# Patient Record
Sex: Female | Born: 1955 | Race: Black or African American | Hispanic: No | Marital: Single | Smoking: Current every day smoker
Health system: Southern US, Community
[De-identification: ages and names within clinical notes are randomized; demographics above are authoritative.]

## PROBLEM LIST (undated history)

## (undated) DIAGNOSIS — I10 Essential (primary) hypertension: Secondary | ICD-10-CM

## (undated) DIAGNOSIS — M1990 Unspecified osteoarthritis, unspecified site: Secondary | ICD-10-CM

## (undated) DIAGNOSIS — I639 Cerebral infarction, unspecified: Secondary | ICD-10-CM

## (undated) DIAGNOSIS — J449 Chronic obstructive pulmonary disease, unspecified: Secondary | ICD-10-CM

## (undated) HISTORY — PX: ABDOMINAL HYSTERECTOMY: SHX81

---

## 2011-05-10 ENCOUNTER — Encounter: Payer: Self-pay | Admitting: *Deleted

## 2011-05-10 ENCOUNTER — Emergency Department (HOSPITAL_BASED_OUTPATIENT_CLINIC_OR_DEPARTMENT_OTHER)
Admission: EM | Admit: 2011-05-10 | Discharge: 2011-05-10 | Discharge status: Against Medical Advice | Payer: No Typology Code available for payment source | Attending: Emergency Medicine | Admitting: Emergency Medicine

## 2011-05-10 DIAGNOSIS — M25519 Pain in unspecified shoulder: Secondary | ICD-10-CM | POA: Insufficient documentation

## 2011-05-10 HISTORY — DX: Essential (primary) hypertension: I10

## 2011-05-10 HISTORY — DX: Unspecified osteoarthritis, unspecified site: M19.90

## 2011-05-10 HISTORY — DX: Chronic obstructive pulmonary disease, unspecified: J44.9
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
## 2012-10-26 ENCOUNTER — Ambulatory Visit (HOSPITAL_COMMUNITY)
Admission: RE | Admit: 2012-10-26 | Discharge: 2012-10-26 | Disposition: A | Discharge status: Home / Self Care | Payer: Self-pay | Source: Ambulatory Visit | Attending: General Surgery | Admitting: General Surgery

## 2012-10-26 ENCOUNTER — Encounter (HOSPITAL_BASED_OUTPATIENT_CLINIC_OR_DEPARTMENT_OTHER): Payer: Self-pay | Attending: General Surgery

## 2012-10-26 ENCOUNTER — Other Ambulatory Visit (HOSPITAL_BASED_OUTPATIENT_CLINIC_OR_DEPARTMENT_OTHER): Payer: Self-pay | Admitting: General Surgery

## 2012-10-26 DIAGNOSIS — L84 Corns and callosities: Secondary | ICD-10-CM | POA: Insufficient documentation

## 2012-10-26 DIAGNOSIS — S91109A Unspecified open wound of unspecified toe(s) without damage to nail, initial encounter: Secondary | ICD-10-CM | POA: Insufficient documentation

## 2012-10-26 DIAGNOSIS — M869 Osteomyelitis, unspecified: Secondary | ICD-10-CM

## 2012-10-26 DIAGNOSIS — L97509 Non-pressure chronic ulcer of other part of unspecified foot with unspecified severity: Secondary | ICD-10-CM | POA: Insufficient documentation

## 2012-10-26 DIAGNOSIS — X58XXXA Exposure to other specified factors, initial encounter: Secondary | ICD-10-CM | POA: Insufficient documentation

## 2012-10-26 DIAGNOSIS — E1169 Type 2 diabetes mellitus with other specified complication: Secondary | ICD-10-CM | POA: Insufficient documentation

## 2012-10-26 DIAGNOSIS — I10 Essential (primary) hypertension: Secondary | ICD-10-CM | POA: Insufficient documentation

## 2012-10-26 DIAGNOSIS — Z79899 Other long term (current) drug therapy: Secondary | ICD-10-CM | POA: Insufficient documentation

## 2012-10-27 NOTE — H&P (Signed)
Ashlee Mora, Ashlee Mora NO.:  [id number]  MEDICAL RECORD NO.:  [id number]  LOCATION:  FOOT                         FACILITY:  [facility]  PHYSICIAN:  Joanne Gavel, M.D.        DATE OF BIRTH:  1955/09/19  DATE OF ADMISSION:  10/26/2012 DATE OF DISCHARGE:                             HISTORY & PHYSICAL   CHIEF COMPLAINT:  Wound, left great toe.  HISTORY OF PRESENT ILLNESS:  This is a 57 year old, diabetic female, developed a blister on her left great toe approximately 2-1/2 weeks ago. She was given antibiotics by her primary care physician.  She developed some drainage from this site.  There has been no pain, tenderness, fever, chills, or sweating spells.  PAST MEDICAL HISTORY:  Significant for osteoarthritis, breast cysts, neuritis, radiculitis of brachial area, COPD, depression, bronchitis, hypertension, hyperlipidemia, hypercholesterolemia, lymphocytosis.  MEDICATIONS:  Ecotrin, Exforge, gabapentin, glipizide, meloxicam, metformin, pravastatin, Proventil inhaler, Prandin, Silvadene, Spiriva, Tessalon, trazodone, Advair, Benadryl.  ALLERGIES:  Chantix and Cymbalta.  CIGARETTES:  She smokes 6-7 cigarettes a day.  ALCOHOL:  None.  REVIEW OF SYSTEMS:  As above.  PHYSICAL EXAMINATION:  VITAL SIGNS:  Temperature 98, pulse 83, respirations 16, blood pressure 145/81.  Glucose is 162. GENERAL APPEARANCE:  A well developed, well nourished, no distress. EYES, EARS, NOSE, THROAT:  Normal. CHEST:  Clear. HEART:  Regular rhythm. Examination of the lower extremities reveals a broken blister on the tip of the right great toe.  ABI is 1.13.  Peripheral pulses are palpable. The area of skin that is uncovered is 1.0 x 1.0, and the wounds are very superficial, practically a pinhole.  PLAN OF TREATMENT:  We will get x-rays to rule out foreign body, and we will treat with collagen and toe sock.  I will see in 7 days.     Joanne Gavel, M.D.     RA/MEDQ  D:  10/26/2012  T:   10/27/2012  Job:  [id number]  cc:   Bernerd Limbo, S

## 2014-02-25 ENCOUNTER — Emergency Department (HOSPITAL_BASED_OUTPATIENT_CLINIC_OR_DEPARTMENT_OTHER)
Admission: EM | Admit: 2014-02-25 | Discharge: 2014-02-25 | Disposition: A | Discharge status: Home / Self Care | Payer: No Typology Code available for payment source | Attending: Emergency Medicine | Admitting: Emergency Medicine

## 2014-02-25 ENCOUNTER — Encounter (HOSPITAL_BASED_OUTPATIENT_CLINIC_OR_DEPARTMENT_OTHER): Payer: Self-pay | Admitting: Emergency Medicine

## 2014-02-25 DIAGNOSIS — IMO0002 Reserved for concepts with insufficient information to code with codable children: Secondary | ICD-10-CM | POA: Diagnosis not present

## 2014-02-25 DIAGNOSIS — Z7982 Long term (current) use of aspirin: Secondary | ICD-10-CM | POA: Insufficient documentation

## 2014-02-25 DIAGNOSIS — M129 Arthropathy, unspecified: Secondary | ICD-10-CM | POA: Diagnosis not present

## 2014-02-25 DIAGNOSIS — E119 Type 2 diabetes mellitus without complications: Secondary | ICD-10-CM | POA: Insufficient documentation

## 2014-02-25 DIAGNOSIS — J4489 Other specified chronic obstructive pulmonary disease: Secondary | ICD-10-CM | POA: Insufficient documentation

## 2014-02-25 DIAGNOSIS — B86 Scabies: Secondary | ICD-10-CM | POA: Diagnosis not present

## 2014-02-25 DIAGNOSIS — J449 Chronic obstructive pulmonary disease, unspecified: Secondary | ICD-10-CM | POA: Diagnosis not present

## 2014-02-25 DIAGNOSIS — I10 Essential (primary) hypertension: Secondary | ICD-10-CM | POA: Diagnosis not present

## 2014-02-25 DIAGNOSIS — Z79899 Other long term (current) drug therapy: Secondary | ICD-10-CM | POA: Diagnosis not present

## 2014-02-25 DIAGNOSIS — R21 Rash and other nonspecific skin eruption: Secondary | ICD-10-CM | POA: Diagnosis present

## 2014-02-25 DIAGNOSIS — F17200 Nicotine dependence, unspecified, uncomplicated: Secondary | ICD-10-CM | POA: Insufficient documentation

## 2014-02-25 MED ORDER — PERMETHRIN 5 % EX CREA: TOPICAL_CREAM | CUTANEOUS | Status: DC

## 2014-02-25 MED ORDER — PERMETHRIN 0.25 % LIQD
Status: AC
  Filled 2014-02-25: qty 147.86

## 2014-02-25 NOTE — ED Provider Notes (Signed)
CSN: [id number]     Arrival date & time 02/25/14  1239 History   First MD Initiated Contact with Patient 02/25/14 1248     Chief Complaint  Patient presents with  . Rash     (Consider location/radiation/quality/duration/timing/severity/associated sxs/prior Treatment) HPI Comments: Patient is a 58 year old female with past medical history diabetes, arthritis, asthma, COPD and hypertension who presents to the emergency department complaining of a rash to her arms and legs times one week. Patient reports the rash is very itchy and is not getting better with over-the-counter poison ivy cream. Denies any new soaps, detergents, lotions or contacts with new pets. No recent travel. Patient reports she cares for her mother who is starting to become itchy. Denies any difficulty breathing or swallowing.  Patient is a 58 y.o. female presenting with rash. The history is provided by the patient.  Rash   Past Medical History  Diagnosis Date  . Diabetes mellitus   . Arthritis   . Asthma   . COPD (chronic obstructive pulmonary disease)   . Hypertension    Past Surgical History  Procedure Laterality Date  . Abdominal hysterectomy     No family history on file. History  Substance Use Topics  . Smoking status: Current Some Day Smoker    Types: Cigarettes  . Smokeless tobacco: Not on file  . Alcohol Use: No   OB History   Grav Para Term Preterm Abortions TAB SAB Ect Mult Living                 Review of Systems  Skin: Positive for rash.  All other systems reviewed and are negative.     Allergies  Review of patient's allergies indicates no known allergies.  Home Medications   Prior to Admission medications   Medication Sig Start Date End Date Taking? Authorizing Provider  omeprazole (PRILOSEC) 20 MG capsule Take 20 mg by mouth daily.   Yes Historical Provider, MD  simvastatin (ZOCOR) 40 MG tablet Take 40 mg by mouth daily.   Yes Historical Provider, MD  albuterol (PROVENTIL  HFA;VENTOLIN HFA) 108 (90 BASE) MCG/ACT inhaler Inhale 2 puffs into the lungs every 6 (six) hours as needed. For shortness of breath and wheezing     Historical Provider, MD  Amlodipine Besylate-Valsartan (EXFORGE PO) Take 1 tablet by mouth daily.      Historical Provider, MD  aspirin EC 81 MG tablet Take 81 mg by mouth daily.      Historical Provider, MD  cholecalciferol (VITAMIN D) 1000 UNITS tablet Take 1,000 Units by mouth daily.      Historical Provider, MD  Fluticasone-Salmeterol (ADVAIR DISKUS IN) Inhale 1 puff into the lungs 2 (two) times daily.      Historical Provider, MD  hydrochlorothiazide (HYDRODIURIL) 25 MG tablet Take 25 mg by mouth daily.      Historical Provider, MD  metFORMIN (GLUCOPHAGE) 500 MG tablet Take 500 mg by mouth 2 (two) times daily with a meal.      Historical Provider, MD  permethrin (ELIMITE) 5 % cream Apply to affected area once, leave on for 8-12 hours and rinse off 02/25/14   Trevor Mace, PA-C  Rosuvastatin Calcium (CRESTOR PO) Take 1 tablet by mouth daily.      Historical Provider, MD  tiotropium (SPIRIVA) 18 MCG inhalation capsule Place 18 mcg into inhaler and inhale daily.      Historical Provider, MD   BP 156/80  Pulse 99  Temp(Src) 98.3 F (36.8 C) (Oral)  Resp 18  Ht 5\' 1"  (1.549 m)  Wt 172 lb (78.019 kg)  BMI 32.52 kg/m2  SpO2 96% Physical Exam  Nursing note and vitals reviewed. Constitutional: She is oriented to person, place, and time. She appears well-developed and well-nourished. No distress.  HENT:  Head: Normocephalic and atraumatic.  Mouth/Throat: Oropharynx is clear and moist.  Eyes: Conjunctivae and EOM are normal.  Neck: Normal range of motion. Neck supple.  Cardiovascular: Normal rate, regular rhythm and normal heart sounds.   Pulmonary/Chest: Effort normal and breath sounds normal. No respiratory distress.  Musculoskeletal: Normal range of motion. She exhibits no edema.  Neurological: She is alert and oriented to person, place,  and time. No sensory deficit.  Skin: Skin is warm and dry.  Few excoriated raised areas on bilateral forearms and lower legs spreading to web spaces of fingers. Spares palms and soles. No mucosal lesions. No signs of secondary infection.  Psychiatric: She has a normal mood and affect. Her behavior is normal.    ED Course  Procedures (including critical care time) Labs Review Labs Reviewed - No data to display  Imaging Review No results found.   EKG Interpretation None      MDM   Final diagnoses:  Scabies   Pt presenting with rash consistent with scabies. She is well appearing and in NAD. AFVSS. Treat with permethrin cream. Infection care/precautions discussed. Stable for d/c. Return precautions given. Patient states understanding of treatment care plan and is agreeable.   Trevor MaceRobyn M Albert, PA-C 02/25/14 [phone]

## 2014-02-25 NOTE — ED Notes (Signed)
Pt discharged to home with family. NAD.  

## 2014-02-25 NOTE — ED Notes (Signed)
Patient here with raised rash to arms and legs that she reports itches and burns, tried otc meds with no relief

## 2014-02-25 NOTE — Discharge Instructions (Signed)
Apply permethrin cream once, leave on for 8-12 hours and rinse off. It is important to thoroughly clean all bed sheets and clothing.  Scabies Scabies are small bugs (mites) that burrow under the skin and cause red bumps and severe itching. These bugs can only be seen with a microscope. Scabies are highly contagious. They can spread easily from person to person by direct contact. They are also spread through sharing clothing or linens that have the scabies mites living in them. It is not unusual for an entire family to become infected through shared towels, clothing, or bedding.  HOME CARE INSTRUCTIONS   Your caregiver may prescribe a cream or lotion to kill the mites. If cream is prescribed, massage the cream into the entire body from the neck to the bottom of both feet. Also massage the cream into the scalp and face if your child is less than 56 year old. Avoid the eyes and mouth. Do not wash your hands after application.  Leave the cream on for 8 to 12 hours. Your child should bathe or shower after the 8 to 12 hour application period. Sometimes it is helpful to apply the cream to your child right before bedtime.  One treatment is usually effective and will eliminate approximately 95% of infestations. For severe cases, your caregiver may decide to repeat the treatment in 1 week. Everyone in your household should be treated with one application of the cream.  New rashes or burrows should not appear within 24 to 48 hours after successful treatment. However, the itching and rash may last for 2 to 4 weeks after successful treatment. Your caregiver may prescribe a medicine to help with the itching or to help the rash go away more quickly.  Scabies can live on clothing or linens for up to 3 days. All of your child's recently used clothing, towels, stuffed toys, and bed linens should be washed in hot water and then dried in a dryer for at least 20 minutes on high heat. Items that cannot be washed should be  enclosed in a plastic bag for at least 3 days.  To help relieve itching, bathe your child in a cool bath or apply cool washcloths to the affected areas.  Your child may return to school after treatment with the prescribed cream. SEEK MEDICAL CARE IF:   The itching persists longer than 4 weeks after treatment.  The rash spreads or becomes infected. Signs of infection include red blisters or yellow-tan crust. Document Released: 06/30/2005 Document Revised: 09/22/2011 Document Reviewed: 11/08/2008 [facility] Patient Information 2015 [employer], [employer]. This information is not intended to replace advice given to you by your health care provider. Make sure you discuss any questions you have with your health care provider.

## 2014-02-25 NOTE — ED Provider Notes (Signed)
Medical screening examination/treatment/procedure(s) were performed by non-physician practitioner and as supervising physician I was immediately available for consultation/collaboration.   EKG Interpretation None       Ethelda ChickMartha K Linker, MD 02/25/14 1309

## 2014-11-16 ENCOUNTER — Emergency Department (HOSPITAL_BASED_OUTPATIENT_CLINIC_OR_DEPARTMENT_OTHER): Payer: Medicaid Other

## 2014-11-16 ENCOUNTER — Encounter (HOSPITAL_BASED_OUTPATIENT_CLINIC_OR_DEPARTMENT_OTHER): Payer: Self-pay | Admitting: Emergency Medicine

## 2014-11-16 ENCOUNTER — Emergency Department (HOSPITAL_BASED_OUTPATIENT_CLINIC_OR_DEPARTMENT_OTHER)
Admission: EM | Admit: 2014-11-16 | Discharge: 2014-11-16 | Disposition: A | Discharge status: Home / Self Care | Payer: Medicaid Other | Attending: Emergency Medicine | Admitting: Emergency Medicine

## 2014-11-16 DIAGNOSIS — Y9289 Other specified places as the place of occurrence of the external cause: Secondary | ICD-10-CM | POA: Diagnosis not present

## 2014-11-16 DIAGNOSIS — S9032XA Contusion of left foot, initial encounter: Secondary | ICD-10-CM | POA: Insufficient documentation

## 2014-11-16 DIAGNOSIS — M1990 Unspecified osteoarthritis, unspecified site: Secondary | ICD-10-CM | POA: Diagnosis not present

## 2014-11-16 DIAGNOSIS — Z7982 Long term (current) use of aspirin: Secondary | ICD-10-CM | POA: Diagnosis not present

## 2014-11-16 DIAGNOSIS — Y998 Other external cause status: Secondary | ICD-10-CM | POA: Insufficient documentation

## 2014-11-16 DIAGNOSIS — E119 Type 2 diabetes mellitus without complications: Secondary | ICD-10-CM | POA: Diagnosis not present

## 2014-11-16 DIAGNOSIS — W108XXA Fall (on) (from) other stairs and steps, initial encounter: Secondary | ICD-10-CM | POA: Diagnosis not present

## 2014-11-16 DIAGNOSIS — J449 Chronic obstructive pulmonary disease, unspecified: Secondary | ICD-10-CM | POA: Diagnosis not present

## 2014-11-16 DIAGNOSIS — Z79899 Other long term (current) drug therapy: Secondary | ICD-10-CM | POA: Insufficient documentation

## 2014-11-16 DIAGNOSIS — I10 Essential (primary) hypertension: Secondary | ICD-10-CM | POA: Insufficient documentation

## 2014-11-16 DIAGNOSIS — Y9389 Activity, other specified: Secondary | ICD-10-CM | POA: Diagnosis not present

## 2014-11-16 DIAGNOSIS — S99922A Unspecified injury of left foot, initial encounter: Secondary | ICD-10-CM | POA: Diagnosis present

## 2014-11-16 DIAGNOSIS — Z720 Tobacco use: Secondary | ICD-10-CM | POA: Insufficient documentation

## 2014-11-16 MED ORDER — ACETAMINOPHEN 325 MG PO TABS
650.0000 mg | ORAL_TABLET | Freq: Once | ORAL | Status: AC
  Administered 2014-11-16: 650 mg via ORAL
  Filled 2014-11-16: qty 2

## 2014-11-16 MED ORDER — HYDROCODONE-ACETAMINOPHEN 5-325 MG PO TABS: 1.0000 | ORAL_TABLET | Freq: Four times a day (QID) | ORAL | Status: AC | PRN

## 2014-11-16 MED ORDER — ACETAMINOPHEN 325 MG PO TABS
ORAL_TABLET | ORAL | Status: AC
  Administered 2014-11-16: 05:00:00
  Filled 2014-11-16: qty 1

## 2014-11-16 NOTE — ED Notes (Signed)
Pt stepped wrong coming down stairs and twisted falling hitting head and injuring left foot. Able to bear weight with difficulty. Swelling noted to lateral aspect of foot.

## 2014-11-16 NOTE — Discharge Instructions (Signed)
Contusion °A contusion is a deep bruise. Contusions are the result of an injury that caused bleeding under the skin. The contusion may turn blue, purple, or yellow. Minor injuries will give you a painless contusion, but more severe contusions may stay painful and swollen for a few weeks.  °CAUSES  °A contusion is usually caused by a blow, trauma, or direct force to an area of the body. °SYMPTOMS  °· Swelling and redness of the injured area. °· Bruising of the injured area. °· Tenderness and soreness of the injured area. °· Pain. °DIAGNOSIS  °The diagnosis can be made by taking a history and physical exam. An X-ray, CT scan, or MRI may be needed to determine if there were any associated injuries, such as fractures. °TREATMENT  °Specific treatment will depend on what area of the body was injured. In general, the best treatment for a contusion is resting, icing, elevating, and applying cold compresses to the injured area. Over-the-counter medicines may also be recommended for pain control. Ask your caregiver what the best treatment is for your contusion. °HOME CARE INSTRUCTIONS  °· Put ice on the injured area. °¨ Put ice in a plastic bag. °¨ Place a towel between your skin and the bag. °¨ Leave the ice on for 15-20 minutes, 3-4 times a day, or as directed by your health care provider. °· Only take over-the-counter or prescription medicines for pain, discomfort, or fever as directed by your caregiver. Your caregiver may recommend avoiding anti-inflammatory medicines (aspirin, ibuprofen, and naproxen) for 48 hours because these medicines may increase bruising. °· Rest the injured area. °· If possible, elevate the injured area to reduce swelling. °SEEK IMMEDIATE MEDICAL CARE IF:  °· You have increased bruising or swelling. °· You have pain that is getting worse. °· Your swelling or pain is not relieved with medicines. °MAKE SURE YOU:  °· Understand these instructions. °· Will watch your condition. °· Will get help right  away if you are not doing well or get worse. °Document Released: 04/09/2005 Document Revised: 07/05/2013 Document Reviewed: 05/05/2011 °[employer]® Patient Information ©2015 [employer]. This information is not intended to replace advice given to you by your health care provider. Make sure you discuss any questions you have with your health care provider. ° °

## 2014-11-16 NOTE — ED Notes (Signed)
Ace wrapped applied to left foot. Ice and elevate instructions given.

## 2014-11-16 NOTE — ED Provider Notes (Signed)
CSN: [id number]     Arrival date & time 11/16/14  0355 History   First MD Initiated Contact with Patient 11/16/14 0400     Chief Complaint  Patient presents with  . Foot Pain     (Consider location/radiation/quality/duration/timing/severity/associated sxs/prior Treatment) HPI  This is a 59 year old female who presents with left foot pain. Patient reports that she tripped and fell on her front porch at approximately 10 PM last night. She injured her left foot. She has been ambulatory but states that it is painful. She denies any other injury. She did not lose consciousness.  Current pain is at 10. Nothing makes it better or worse. She has not taken any medications for the pain.  Past Medical History  Diagnosis Date  . Diabetes mellitus   . Arthritis   . Asthma   . COPD (chronic obstructive pulmonary disease)   . Hypertension    Past Surgical History  Procedure Laterality Date  . Abdominal hysterectomy     No family history on file. History  Substance Use Topics  . Smoking status: Current Some Day Smoker    Types: Cigarettes  . Smokeless tobacco: Not on file  . Alcohol Use: No   OB History    No data available     Review of Systems  Musculoskeletal:       Left foot pain  Skin: Negative for wound.  All other systems reviewed and are negative.     Allergies  Review of patient's allergies indicates no known allergies.  Home Medications   Prior to Admission medications   Medication Sig Start Date End Date Taking? Authorizing Provider  aspirin EC 81 MG tablet Take 81 mg by mouth daily.     Yes Historical Provider, MD  cholecalciferol (VITAMIN D) 1000 UNITS tablet Take 1,000 Units by mouth daily.     Yes Historical Provider, MD  Fluticasone-Salmeterol (ADVAIR DISKUS IN) Inhale 1 puff into the lungs 2 (two) times daily.     Yes Historical Provider, MD  hydrochlorothiazide (HYDRODIURIL) 25 MG tablet Take 25 mg by mouth daily.     Yes Historical Provider, MD  metFORMIN  (GLUCOPHAGE) 500 MG tablet Take 500 mg by mouth 2 (two) times daily with a meal.     Yes Historical Provider, MD  omeprazole (PRILOSEC) 20 MG capsule Take 20 mg by mouth daily.   Yes Historical Provider, MD  tiotropium (SPIRIVA) 18 MCG inhalation capsule Place 18 mcg into inhaler and inhale daily.     Yes Historical Provider, MD  albuterol (PROVENTIL HFA;VENTOLIN HFA) 108 (90 BASE) MCG/ACT inhaler Inhale 2 puffs into the lungs every 6 (six) hours as needed. For shortness of breath and wheezing     Historical Provider, MD  Amlodipine Besylate-Valsartan (EXFORGE PO) Take 1 tablet by mouth daily.      Historical Provider, MD  HYDROcodone-acetaminophen (NORCO/VICODIN) 5-325 MG per tablet Take 1 tablet by mouth every 6 (six) hours as needed. 11/16/14   Shon Batonourtney F Ysidro Ramsay, MD  permethrin (ELIMITE) 5 % cream Apply to affected area once, leave on for 8-12 hours and rinse off 02/25/14   Nada Boozerobyn M Hess, PA-C  Rosuvastatin Calcium (CRESTOR PO) Take 1 tablet by mouth daily.      Historical Provider, MD  simvastatin (ZOCOR) 40 MG tablet Take 40 mg by mouth daily.    Historical Provider, MD   BP 146/82 mmHg  Pulse 108  Temp(Src) 98.8 F (37.1 C) (Oral)  Resp 20  Ht 5\' 1"  (1.549 m)  Wt 172 lb (78.019 kg)  BMI 32.52 kg/m2  SpO2 98% Physical Exam  Constitutional: She is oriented to person, place, and time. She appears well-developed and well-nourished.  HENT:  Head: Normocephalic and atraumatic.  Cardiovascular: Normal rate and regular rhythm.   Pulmonary/Chest: Effort normal. No respiratory distress.  Musculoskeletal:  Focused examination of the left foot reveals tenderness to palpation over the lateral dorsum of the right foot with mild swelling and contusion noted, no bony abnormality, no midfoot tenderness, 2+ DP pulses, neurovascularly intact distally  Neurological: She is alert and oriented to person, place, and time.  Skin: Skin is warm and dry.  Psychiatric: She has a normal mood and affect.  Nursing  note and vitals reviewed.   ED Course  Procedures (including critical care time) Labs Review Labs Reviewed - No data to display  Imaging Review Dg Foot Complete Left  11/16/2014   CLINICAL DATA:  Larey SeatFell going down stairs, LEFT foot pain, lateral foot swelling. History of arthritis.  EXAM: LEFT FOOT - COMPLETE 3+ VIEW  COMPARISON:  Great toe radiographs October 26, 2012  FINDINGS: There is no evidence of acute fracture or dislocation. Os perineum. Corticated fragmentation of the anterior process of the calcaneus suggest remote injury. Small plantar calcaneal spur. No destructive bony lesions. Mild midfoot osteoarthrosis. Soft tissue planes are nonsuspicious.  IMPRESSION: Suspected remote fracture of the anterior process of the calcaneus, recommend correlation with point tenderness. No convincing evidence of acute fracture deformity or dislocation.   Electronically Signed   By: Awilda Metroourtnay  Bloomer   On: 11/16/2014 04:30     EKG Interpretation None      MDM   Final diagnoses:  Foot contusion, left, initial encounter   patient presents with left foot pain following what is described as mechanical fall. Tenderness and swelling of the lateral dorsum left foot. No obvious deformity and neurovascularly intact. Plain films are negative for acute injury. They do show possible remote injury. Patient does report injuring her foot many years ago. Discuss with patient supportive care at home including pain medications, rest, ice, compression, and elevation. Patient stated understanding.  After history, exam, and medical workup I feel the patient has been appropriately medically screened and is safe for discharge home. Pertinent diagnoses were discussed with the patient. Patient was given return precautions.   Shon Batonourtney F Lada Fulbright, MD 11/16/14 0600

## 2015-01-22 ENCOUNTER — Encounter (HOSPITAL_BASED_OUTPATIENT_CLINIC_OR_DEPARTMENT_OTHER): Payer: Self-pay | Admitting: *Deleted

## 2015-01-22 ENCOUNTER — Emergency Department (HOSPITAL_BASED_OUTPATIENT_CLINIC_OR_DEPARTMENT_OTHER)
Admission: EM | Admit: 2015-01-22 | Discharge: 2015-01-22 | Disposition: A | Discharge status: Home / Self Care | Payer: Medicaid Other | Attending: Emergency Medicine | Admitting: Emergency Medicine

## 2015-01-22 DIAGNOSIS — Y998 Other external cause status: Secondary | ICD-10-CM | POA: Diagnosis not present

## 2015-01-22 DIAGNOSIS — J449 Chronic obstructive pulmonary disease, unspecified: Secondary | ICD-10-CM | POA: Diagnosis not present

## 2015-01-22 DIAGNOSIS — L299 Pruritus, unspecified: Secondary | ICD-10-CM | POA: Diagnosis present

## 2015-01-22 DIAGNOSIS — Z7982 Long term (current) use of aspirin: Secondary | ICD-10-CM | POA: Diagnosis not present

## 2015-01-22 DIAGNOSIS — Z7951 Long term (current) use of inhaled steroids: Secondary | ICD-10-CM | POA: Insufficient documentation

## 2015-01-22 DIAGNOSIS — Y9389 Activity, other specified: Secondary | ICD-10-CM | POA: Insufficient documentation

## 2015-01-22 DIAGNOSIS — M1990 Unspecified osteoarthritis, unspecified site: Secondary | ICD-10-CM | POA: Diagnosis not present

## 2015-01-22 DIAGNOSIS — W57XXXA Bitten or stung by nonvenomous insect and other nonvenomous arthropods, initial encounter: Secondary | ICD-10-CM | POA: Diagnosis not present

## 2015-01-22 DIAGNOSIS — I10 Essential (primary) hypertension: Secondary | ICD-10-CM | POA: Insufficient documentation

## 2015-01-22 DIAGNOSIS — S80862A Insect bite (nonvenomous), left lower leg, initial encounter: Secondary | ICD-10-CM | POA: Diagnosis not present

## 2015-01-22 DIAGNOSIS — Y9289 Other specified places as the place of occurrence of the external cause: Secondary | ICD-10-CM | POA: Insufficient documentation

## 2015-01-22 DIAGNOSIS — Z720 Tobacco use: Secondary | ICD-10-CM | POA: Diagnosis not present

## 2015-01-22 DIAGNOSIS — Z79899 Other long term (current) drug therapy: Secondary | ICD-10-CM | POA: Diagnosis not present

## 2015-01-22 DIAGNOSIS — S1086XA Insect bite of other specified part of neck, initial encounter: Secondary | ICD-10-CM | POA: Insufficient documentation

## 2015-01-22 DIAGNOSIS — E119 Type 2 diabetes mellitus without complications: Secondary | ICD-10-CM | POA: Insufficient documentation

## 2015-01-22 MED ORDER — DOXYCYCLINE HYCLATE 100 MG PO TABS: 100.0000 mg | ORAL_TABLET | Freq: Two times a day (BID) | ORAL | Status: DC

## 2015-01-22 MED ORDER — HYDROXYZINE HCL 25 MG PO TABS: 25.0000 mg | ORAL_TABLET | Freq: Four times a day (QID) | ORAL | Status: AC

## 2015-01-22 MED ORDER — DEXAMETHASONE SODIUM PHOSPHATE 4 MG/ML IJ SOLN
8.0000 mg | Freq: Once | INTRAMUSCULAR | Status: AC
  Administered 2015-01-22: 8 mg via INTRAMUSCULAR
  Filled 2015-01-22: qty 2

## 2015-01-22 MED ORDER — DOXYCYCLINE HYCLATE 100 MG PO TABS
100.0000 mg | ORAL_TABLET | Freq: Once | ORAL | Status: AC
  Administered 2015-01-22: 100 mg via ORAL
  Filled 2015-01-22: qty 1

## 2015-01-22 MED ORDER — MELOXICAM 7.5 MG PO TABS: 7.5000 mg | ORAL_TABLET | Freq: Every day | ORAL | Status: AC

## 2015-01-22 NOTE — Discharge Instructions (Signed)
Please use warm Epsom salt soaks to the right on your neck and on your ankle and foot  daily. Please use doxycycline and Mobic daily until all taken. May use Vistaril for itching or burning. This medication may cause drowsiness, please use this medication with caution. Please see your primary physician, or return to the emergency department if any signs of advancing infection.

## 2015-01-22 NOTE — ED Provider Notes (Signed)
CSN: [id number]     Arrival date & time 01/22/15  1646 History   First MD Initiated Contact with Patient 01/22/15 1725     Chief Complaint  Patient presents with  . Insect Bite     (Consider location/radiation/quality/duration/timing/severity/associated sxs/prior Treatment) HPI Comments: Pt presents to ED with multiple insect bites. She is concerned that some of them on her leg may be related to spiders. She noted an abscess type area of the lower left leg,  Four red raised bumps on the left neck. She reports itching. The area on the leg had a blister that is now drying in.  This started 3 or 4 days ago. No fever. No red streaks at either site, Not sensation of feeling ill..   The history is provided by the patient.    Past Medical History  Diagnosis Date  . Diabetes mellitus   . Arthritis   . Asthma   . COPD (chronic obstructive pulmonary disease)   . Hypertension    Past Surgical History  Procedure Laterality Date  . Abdominal hysterectomy     No family history on file. History  Substance Use Topics  . Smoking status: Current Some Day Smoker    Types: Cigarettes  . Smokeless tobacco: Not on file  . Alcohol Use: No   OB History    No data available     Review of Systems  Respiratory: Positive for shortness of breath.   Musculoskeletal: Positive for arthralgias.  Skin: Positive for rash.  All other systems reviewed and are negative.     Allergies  Review of patient's allergies indicates no known allergies.  Home Medications   Prior to Admission medications   Medication Sig Start Date End Date Taking? Authorizing Provider  simvastatin (ZOCOR) 40 MG tablet Take 40 mg by mouth daily.   Yes Historical Provider, MD  tiotropium (SPIRIVA) 18 MCG inhalation capsule Place 18 mcg into inhaler and inhale daily.     Yes Historical Provider, MD  albuterol (PROVENTIL HFA;VENTOLIN HFA) 108 (90 BASE) MCG/ACT inhaler Inhale 2 puffs into the lungs every 6 (six) hours as  needed. For shortness of breath and wheezing     Historical Provider, MD  Amlodipine Besylate-Valsartan (EXFORGE PO) Take 1 tablet by mouth daily.      Historical Provider, MD  aspirin EC 81 MG tablet Take 81 mg by mouth daily.      Historical Provider, MD  cholecalciferol (VITAMIN D) 1000 UNITS tablet Take 1,000 Units by mouth daily.      Historical Provider, MD  Fluticasone-Salmeterol (ADVAIR DISKUS IN) Inhale 1 puff into the lungs 2 (two) times daily.      Historical Provider, MD  hydrochlorothiazide (HYDRODIURIL) 25 MG tablet Take 25 mg by mouth daily.      Historical Provider, MD  HYDROcodone-acetaminophen (NORCO/VICODIN) 5-325 MG per tablet Take 1 tablet by mouth every 6 (six) hours as needed. 11/16/14   Shon Baton, MD  metFORMIN (GLUCOPHAGE) 500 MG tablet Take 500 mg by mouth 2 (two) times daily with a meal.      Historical Provider, MD  omeprazole (PRILOSEC) 20 MG capsule Take 20 mg by mouth daily.    Historical Provider, MD  permethrin (ELIMITE) 5 % cream Apply to affected area once, leave on for 8-12 hours and rinse off 02/25/14   Robyn M Hess, PA-C  Rosuvastatin Calcium (CRESTOR PO) Take 1 tablet by mouth daily.      Historical Provider, MD   BP 132/65 mmHg  Pulse 70  Temp(Src) 98.3 F (36.8 C) (Oral)  Resp 18  Ht 5\' 1"  (1.549 m)  Wt 173 lb (78.472 kg)  BMI 32.70 kg/m2  SpO2 95% Physical Exam  Constitutional: She is oriented to person, place, and time. She appears well-developed and well-nourished.  Non-toxic appearance.  HENT:  Head: Normocephalic.  Right Ear: Tympanic membrane and external ear normal.  Left Ear: Tympanic membrane and external ear normal.  Four red raised lesions of the left side of the neck. No red streaking. No drainage. Trachea mid line. No stridor  Eyes: EOM and lids are normal. Pupils are equal, round, and reactive to light.  Neck: Normal range of motion. Neck supple. Carotid bruit is not present.  Cardiovascular: Normal rate, regular rhythm,  normal heart sounds, intact distal pulses and normal pulses.   Pulmonary/Chest: Breath sounds normal. No respiratory distress.  Abdominal: Soft. Bowel sounds are normal. There is no tenderness. There is no guarding.  Musculoskeletal: Normal range of motion.  2 resolving lesions of the left lower leg. No red streaks. No active drainage. The area is not hot. FROM of the left ankle and knee. Pt ambulatory without problem.  Lymphadenopathy:       Head (right side): No submandibular adenopathy present.       Head (left side): No submandibular adenopathy present.    She has no cervical adenopathy.  Neurological: She is alert and oriented to person, place, and time. She has normal strength. No cranial nerve deficit or sensory deficit.  Skin: Skin is warm and dry.  Psychiatric: She has a normal mood and affect. Her speech is normal.  Nursing note and vitals reviewed.   ED Course  Procedures (including critical care time) Labs Review Labs Reviewed - No data to display  Imaging Review No results found.   EKG Interpretation None      MDM  Vital signs stable. Pt ambulatory without problem. No red streaks or hot areas. The pt will be treated with doxycycline and atarax for itching, and mobic for inflammation. Pt to use warm but soaks to the affected areas.She is to return to the ED if any changes or problem.   Final diagnoses:  None    *I have reviewed nursing notes, vital signs, and all appropriate lab and imaging results for this patient.[address]    Yessenia Maillet, PA-C 01/24/15 1052  Geoffery Lyonsouglas Delo, MD 01/26/15 [phone]

## 2015-01-22 NOTE — ED Notes (Signed)
Erythematous, circular, pruritic area on dorsal aspect of left foot. Small area of erythema on toe.  Areas marked. Three separate linear raised, red areas on left lateral neck x 2 days.

## 2015-01-22 NOTE — ED Notes (Addendum)
Pt has am abscess area on her lower left leg since Friday and has 4-5 red, raised areas on her left lateral neck since Saturday. Pt recently began using a patch to help her stop smoking.

## 2016-04-17 ENCOUNTER — Encounter (HOSPITAL_BASED_OUTPATIENT_CLINIC_OR_DEPARTMENT_OTHER): Payer: Self-pay | Admitting: *Deleted

## 2016-04-17 ENCOUNTER — Emergency Department (HOSPITAL_BASED_OUTPATIENT_CLINIC_OR_DEPARTMENT_OTHER)
Admission: EM | Admit: 2016-04-17 | Discharge: 2016-04-17 | Disposition: A | Discharge status: Home / Self Care | Payer: Medicaid Other | Attending: Emergency Medicine | Admitting: Emergency Medicine

## 2016-04-17 DIAGNOSIS — I10 Essential (primary) hypertension: Secondary | ICD-10-CM | POA: Diagnosis not present

## 2016-04-17 DIAGNOSIS — F17210 Nicotine dependence, cigarettes, uncomplicated: Secondary | ICD-10-CM | POA: Insufficient documentation

## 2016-04-17 DIAGNOSIS — R0981 Nasal congestion: Secondary | ICD-10-CM | POA: Insufficient documentation

## 2016-04-17 DIAGNOSIS — J449 Chronic obstructive pulmonary disease, unspecified: Secondary | ICD-10-CM | POA: Diagnosis not present

## 2016-04-17 DIAGNOSIS — R42 Dizziness and giddiness: Secondary | ICD-10-CM | POA: Diagnosis present

## 2016-04-17 DIAGNOSIS — J45909 Unspecified asthma, uncomplicated: Secondary | ICD-10-CM | POA: Insufficient documentation

## 2016-04-17 DIAGNOSIS — Z79899 Other long term (current) drug therapy: Secondary | ICD-10-CM | POA: Insufficient documentation

## 2016-04-17 DIAGNOSIS — Z792 Long term (current) use of antibiotics: Secondary | ICD-10-CM | POA: Diagnosis not present

## 2016-04-17 DIAGNOSIS — H6690 Otitis media, unspecified, unspecified ear: Secondary | ICD-10-CM

## 2016-04-17 DIAGNOSIS — H8122 Vestibular neuronitis, left ear: Secondary | ICD-10-CM | POA: Diagnosis not present

## 2016-04-17 DIAGNOSIS — Z7984 Long term (current) use of oral hypoglycemic drugs: Secondary | ICD-10-CM | POA: Insufficient documentation

## 2016-04-17 DIAGNOSIS — E119 Type 2 diabetes mellitus without complications: Secondary | ICD-10-CM | POA: Insufficient documentation

## 2016-04-17 DIAGNOSIS — H6692 Otitis media, unspecified, left ear: Secondary | ICD-10-CM | POA: Diagnosis not present

## 2016-04-17 MED ORDER — AMOXICILLIN 500 MG PO CAPS: 1000.0000 mg | ORAL_CAPSULE | Freq: Two times a day (BID) | ORAL | 0 refills | Status: DC

## 2016-04-17 MED ORDER — DEXAMETHASONE 6 MG PO TABS: 10.0000 mg | ORAL_TABLET | Freq: Once | ORAL | Status: DC

## 2016-04-17 MED ORDER — MECLIZINE HCL 25 MG PO TABS: 25.0000 mg | ORAL_TABLET | Freq: Three times a day (TID) | ORAL | 0 refills | Status: DC | PRN

## 2016-04-17 NOTE — ED Triage Notes (Signed)
Dizziness since yesterday. Left hand has been painful x 3 weeks. She was put on Celebrex by her MD for the pain with no relief.

## 2016-04-17 NOTE — Discharge Instructions (Signed)
Follow up with your family doc, return if unable to walk

## 2016-04-17 NOTE — ED Provider Notes (Signed)
[facility] Provider [facility]   CSN: [id number] Arrival date & time: 04/17/16  1611     History   Chief Complaint Chief Complaint  Patient presents with  . Dizziness  . Otalgia    left ear.    HPI Ashlee Mora is a 60 y.o. female.  60 yo F with a cc of vertigo.  Going on past couple days.  Worse with turning her head or moving her eyes. Patient gets much better with holding her head still. Patient also been complaining of some congestion and left ear fullness. Having a mild cough denies shortness breath denies fevers.   The history is provided by the patient.  Dizziness  Quality:  Head spinning Severity:  Moderate Onset quality:  Gradual Duration:  2 days Timing:  Constant Progression:  Worsening Chronicity:  New Relieved by:  Nothing Worsened by:  Nothing Ineffective treatments:  None tried Associated symptoms: no chest pain, no headaches, no nausea, no palpitations, no shortness of breath and no vomiting   Otalgia  Pertinent negatives include no headaches, no rhinorrhea and no vomiting.    Past Medical History:  Diagnosis Date  . Arthritis   . Asthma   . COPD (chronic obstructive pulmonary disease) (HCC)   . Diabetes mellitus   . Hypertension     There are no active problems to display for this patient.   Past Surgical History:  Procedure Laterality Date  . ABDOMINAL HYSTERECTOMY      OB History    No data available       Home Medications    Prior to Admission medications   Medication Sig Start Date End Date Taking? Authorizing Provider  albuterol (PROVENTIL HFA;VENTOLIN HFA) 108 (90 BASE) MCG/ACT inhaler Inhale 2 puffs into the lungs every 6 (six) hours as needed. For shortness of breath and wheezing    Yes Historical Provider, MD  Amlodipine Besylate-Valsartan (EXFORGE PO) Take 1 tablet by mouth daily.     Yes Historical Provider, MD  aspirin EC 81 MG tablet Take 81 mg by mouth daily.     Yes Historical Provider, MD  cholecalciferol  (VITAMIN D) 1000 UNITS tablet Take 1,000 Units by mouth daily.     Yes Historical Provider, MD  Fluticasone-Salmeterol (ADVAIR DISKUS IN) Inhale 1 puff into the lungs 2 (two) times daily.     Yes Historical Provider, MD  hydrochlorothiazide (HYDRODIURIL) 25 MG tablet Take 25 mg by mouth daily.     Yes Historical Provider, MD  omeprazole (PRILOSEC) 20 MG capsule Take 20 mg by mouth daily.   Yes Historical Provider, MD  Rosuvastatin Calcium (CRESTOR PO) Take 1 tablet by mouth daily.     Yes Historical Provider, MD  simvastatin (ZOCOR) 40 MG tablet Take 40 mg by mouth daily.   Yes Historical Provider, MD  tiotropium (SPIRIVA) 18 MCG inhalation capsule Place 18 mcg into inhaler and inhale daily.     Yes Historical Provider, MD  amoxicillin (AMOXIL) 500 MG capsule Take 2 capsules (1,000 mg total) by mouth 2 (two) times daily. 04/17/16   Melene Plan, DO  doxycycline (VIBRA-TABS) 100 MG tablet Take 1 tablet (100 mg total) by mouth 2 (two) times daily. 01/22/15   Ivery Quale, PA-C  HYDROcodone-acetaminophen (NORCO/VICODIN) 5-325 MG per tablet Take 1 tablet by mouth every 6 (six) hours as needed. 11/16/14   Shon Baton, MD  hydrOXYzine (ATARAX/VISTARIL) 25 MG tablet Take 1 tablet (25 mg total) by mouth every 6 (six) hours. 01/22/15   Link Snuffer  Beverely Pace, PA-C  meclizine (ANTIVERT) 25 MG tablet Take 1 tablet (25 mg total) by mouth 3 (three) times daily as needed for dizziness. 04/17/16   Melene Plan, DO  meloxicam (MOBIC) 7.5 MG tablet Take 1 tablet (7.5 mg total) by mouth daily. 01/22/15   Ivery Quale, PA-C  metFORMIN (GLUCOPHAGE) 500 MG tablet Take 500 mg by mouth 2 (two) times daily with a meal.      Historical Provider, MD  permethrin (ELIMITE) 5 % cream Apply to affected area once, leave on for 8-12 hours and rinse off 02/25/14   Kathrynn Speed, PA-C    Family History No family history on file.  Social History Social History  Substance Use Topics  . Smoking status: Current Some Day Smoker    Types:  Cigarettes  . Smokeless tobacco: Never Used  . Alcohol use No     Allergies   Review of patient's allergies indicates no known allergies.   Review of Systems Review of Systems  Constitutional: Negative for chills and fever.  HENT: Positive for congestion and ear pain. Negative for rhinorrhea.   Eyes: Negative for redness and visual disturbance.  Respiratory: Negative for shortness of breath and wheezing.   Cardiovascular: Negative for chest pain and palpitations.  Gastrointestinal: Negative for nausea and vomiting.  Genitourinary: Negative for dysuria and urgency.  Musculoskeletal: Negative for arthralgias and myalgias.  Skin: Negative for pallor and wound.  Neurological: Positive for dizziness. Negative for headaches.     Physical Exam Updated Vital Signs BP 123/72   Pulse 88   Temp 98.2 F (36.8 C) (Oral)   Resp 18   Ht 5\' 1"  (1.549 m)   Wt 178 lb (80.7 kg)   SpO2 97%   BMI 33.63 kg/m   Physical Exam  Constitutional: She is oriented to person, place, and time. She appears well-developed and well-nourished. No distress.  HENT:  Head: Normocephalic and atraumatic.  Eyes: EOM are normal. Pupils are equal, round, and reactive to light.  Neck: Normal range of motion. Neck supple.  Cardiovascular: Normal rate and regular rhythm.  Exam reveals no gallop and no friction rub.   No murmur heard. Pulmonary/Chest: Effort normal. She has no wheezes. She has no rales.  Abdominal: Soft. She exhibits no distension. There is no tenderness.  Musculoskeletal: She exhibits no edema or tenderness.  Neurological: She is alert and oriented to person, place, and time. She has normal strength. No cranial nerve deficit or sensory deficit. She displays a negative Romberg sign. Coordination and gait normal. GCS eye subscore is 4. GCS verbal subscore is 5. GCS motor subscore is 6. She displays no Babinski's sign on the right side. She displays no Babinski's sign on the left side.  Reflex  Scores:      Tricep reflexes are 2+ on the right side and 2+ on the left side.      Bicep reflexes are 2+ on the right side and 2+ on the left side.      Brachioradialis reflexes are 2+ on the right side and 2+ on the left side.      Patellar reflexes are 2+ on the right side and 2+ on the left side.      Achilles reflexes are 2+ on the right side and 2+ on the left side. Skin: Skin is warm and dry. She is not diaphoretic.  Psychiatric: She has a normal mood and affect. Her behavior is normal.  Nursing note and vitals reviewed.    ED Treatments /  Results  Labs (all labs ordered are listed, but only abnormal results are displayed) Labs Reviewed - No data to display  EKG  EKG Interpretation None       Radiology No results found.  Procedures Procedures (including critical care time)  Medications Ordered in ED Medications  dexamethasone (DECADRON) tablet 10 mg (not administered)     Initial Impression / Assessment and Plan / ED Course  I have reviewed the triage vital signs and the nursing notes.  Pertinent labs & imaging results that were available during my care of the patient were reviewed by me and considered in my medical decision making (see chart for details).  Clinical Course    60 yo F With a chief complaint of peripheral vertigo. Suspect that patient has vestibular neuritis based on her symptoms. We'll give additional steroids for also for otitis media.  4:49 PM:  I have discussed the diagnosis/risks/treatment options with the patient and family and believe the pt to be eligible for discharge home to follow-up with PCP. We also discussed returning to the ED immediately if new or worsening sx occur. We discussed the sx which are most concerning (e.g., sudden worsening pain, fever, inability to tolerate by mouth) that necessitate immediate return. Medications administered to the patient during their visit and any new prescriptions provided to the patient are listed  below.  Medications given during this visit Medications  dexamethasone (DECADRON) tablet 10 mg (not administered)     The patient appears reasonably screen and/or stabilized for discharge and I doubt any other medical condition or other [facility] requiring further screening, evaluation, or treatment in the ED at this time prior to discharge.    Final Clinical Impressions(s) / ED Diagnoses   Final diagnoses:  Acute vestibular neuritis, left  Acute otitis media, unspecified otitis media type    New Prescriptions New Prescriptions   AMOXICILLIN (AMOXIL) 500 MG CAPSULE    Take 2 capsules (1,000 mg total) by mouth 2 (two) times daily.   MECLIZINE (ANTIVERT) 25 MG TABLET    Take 1 tablet (25 mg total) by mouth 3 (three) times daily as needed for dizziness.     Melene Planan Raquan Iannone, DO 04/17/16 1649

## 2017-01-28 ENCOUNTER — Emergency Department (HOSPITAL_BASED_OUTPATIENT_CLINIC_OR_DEPARTMENT_OTHER)
Admission: EM | Admit: 2017-01-28 | Discharge: 2017-01-28 | Disposition: A | Discharge status: Home / Self Care | Payer: Medicaid Other | Attending: Emergency Medicine | Admitting: Emergency Medicine

## 2017-01-28 ENCOUNTER — Encounter (HOSPITAL_BASED_OUTPATIENT_CLINIC_OR_DEPARTMENT_OTHER): Payer: Self-pay | Admitting: *Deleted

## 2017-01-28 DIAGNOSIS — I10 Essential (primary) hypertension: Secondary | ICD-10-CM | POA: Insufficient documentation

## 2017-01-28 DIAGNOSIS — Y929 Unspecified place or not applicable: Secondary | ICD-10-CM | POA: Diagnosis not present

## 2017-01-28 DIAGNOSIS — F17210 Nicotine dependence, cigarettes, uncomplicated: Secondary | ICD-10-CM | POA: Insufficient documentation

## 2017-01-28 DIAGNOSIS — Z79899 Other long term (current) drug therapy: Secondary | ICD-10-CM | POA: Insufficient documentation

## 2017-01-28 DIAGNOSIS — S29012A Strain of muscle and tendon of back wall of thorax, initial encounter: Secondary | ICD-10-CM | POA: Insufficient documentation

## 2017-01-28 DIAGNOSIS — S4992XA Unspecified injury of left shoulder and upper arm, initial encounter: Secondary | ICD-10-CM | POA: Diagnosis present

## 2017-01-28 DIAGNOSIS — J449 Chronic obstructive pulmonary disease, unspecified: Secondary | ICD-10-CM | POA: Diagnosis not present

## 2017-01-28 DIAGNOSIS — S139XXA Sprain of joints and ligaments of unspecified parts of neck, initial encounter: Secondary | ICD-10-CM

## 2017-01-28 DIAGNOSIS — S39012A Strain of muscle, fascia and tendon of lower back, initial encounter: Secondary | ICD-10-CM | POA: Insufficient documentation

## 2017-01-28 DIAGNOSIS — E119 Type 2 diabetes mellitus without complications: Secondary | ICD-10-CM | POA: Insufficient documentation

## 2017-01-28 DIAGNOSIS — Z7984 Long term (current) use of oral hypoglycemic drugs: Secondary | ICD-10-CM | POA: Diagnosis not present

## 2017-01-28 DIAGNOSIS — T148XXA Other injury of unspecified body region, initial encounter: Secondary | ICD-10-CM

## 2017-01-28 DIAGNOSIS — Y939 Activity, unspecified: Secondary | ICD-10-CM | POA: Diagnosis not present

## 2017-01-28 DIAGNOSIS — Y999 Unspecified external cause status: Secondary | ICD-10-CM | POA: Diagnosis not present

## 2017-01-28 DIAGNOSIS — M25512 Pain in left shoulder: Secondary | ICD-10-CM

## 2017-01-28 DIAGNOSIS — S335XXA Sprain of ligaments of lumbar spine, initial encounter: Secondary | ICD-10-CM

## 2017-01-28 MED ORDER — DICLOFENAC SODIUM 1 % TD GEL: 2.0000 g | Freq: Four times a day (QID) | TRANSDERMAL | 0 refills | Status: AC

## 2017-01-28 MED ORDER — CYCLOBENZAPRINE HCL 10 MG PO TABS: 10.0000 mg | ORAL_TABLET | Freq: Two times a day (BID) | ORAL | 0 refills | Status: AC | PRN
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
## 2018-01-15 ENCOUNTER — Emergency Department (HOSPITAL_BASED_OUTPATIENT_CLINIC_OR_DEPARTMENT_OTHER): Payer: Medicaid Other

## 2018-01-15 ENCOUNTER — Emergency Department (HOSPITAL_BASED_OUTPATIENT_CLINIC_OR_DEPARTMENT_OTHER)
Admission: EM | Admit: 2018-01-15 | Discharge: 2018-01-15 | Disposition: A | Discharge status: Home / Self Care | Payer: Medicaid Other | Attending: Emergency Medicine | Admitting: Emergency Medicine

## 2018-01-15 ENCOUNTER — Other Ambulatory Visit: Payer: Self-pay

## 2018-01-15 ENCOUNTER — Encounter (HOSPITAL_BASED_OUTPATIENT_CLINIC_OR_DEPARTMENT_OTHER): Payer: Self-pay

## 2018-01-15 DIAGNOSIS — F17210 Nicotine dependence, cigarettes, uncomplicated: Secondary | ICD-10-CM | POA: Insufficient documentation

## 2018-01-15 DIAGNOSIS — R42 Dizziness and giddiness: Secondary | ICD-10-CM | POA: Diagnosis not present

## 2018-01-15 DIAGNOSIS — R110 Nausea: Secondary | ICD-10-CM | POA: Insufficient documentation

## 2018-01-15 DIAGNOSIS — Z7984 Long term (current) use of oral hypoglycemic drugs: Secondary | ICD-10-CM | POA: Insufficient documentation

## 2018-01-15 DIAGNOSIS — R05 Cough: Secondary | ICD-10-CM | POA: Insufficient documentation

## 2018-01-15 DIAGNOSIS — E119 Type 2 diabetes mellitus without complications: Secondary | ICD-10-CM | POA: Diagnosis not present

## 2018-01-15 DIAGNOSIS — J449 Chronic obstructive pulmonary disease, unspecified: Secondary | ICD-10-CM | POA: Insufficient documentation

## 2018-01-15 DIAGNOSIS — I10 Essential (primary) hypertension: Secondary | ICD-10-CM | POA: Diagnosis not present

## 2018-01-15 DIAGNOSIS — Z8673 Personal history of transient ischemic attack (TIA), and cerebral infarction without residual deficits: Secondary | ICD-10-CM | POA: Diagnosis not present

## 2018-01-15 DIAGNOSIS — Z79899 Other long term (current) drug therapy: Secondary | ICD-10-CM | POA: Diagnosis not present

## 2018-01-15 HISTORY — DX: Cerebral infarction, unspecified: I63.9

## 2018-01-15 LAB — COMPREHENSIVE METABOLIC PANEL
ALT: 17 U/L (ref 0–44)
ANION GAP: 8 (ref 5–15)
AST: 22 U/L (ref 15–41)
Albumin: 4 g/dL (ref 3.5–5.0)
Alkaline Phosphatase: 110 U/L (ref 38–126)
BILIRUBIN TOTAL: 0.6 mg/dL (ref 0.3–1.2)
BUN: 14 mg/dL (ref 6–20)
CO2: 30 mmol/L (ref 22–32)
Calcium: 8.7 mg/dL — ABNORMAL LOW (ref 8.9–10.3)
Chloride: 103 mmol/L (ref 98–111)
Creatinine, Ser: 0.87 mg/dL (ref 0.44–1.00)
GFR calc Af Amer: >60 mL/min (ref >60)
Glucose, Bld: 136 mg/dL — ABNORMAL HIGH (ref 70–99)
POTASSIUM: 3.2 mmol/L — AB (ref 3.5–5.1)
Sodium: 141 mmol/L (ref 135–145)
TOTAL PROTEIN: 7.8 g/dL (ref 6.5–8.1)

## 2018-01-15 LAB — URINALYSIS, ROUTINE W REFLEX MICROSCOPIC
Bilirubin Urine: NEGATIVE
Glucose, UA: >=500 mg/dL — AB
HGB URINE DIPSTICK: NEGATIVE
Ketones, ur: NEGATIVE mg/dL
Leukocytes, UA: NEGATIVE
Nitrite: NEGATIVE
Protein, ur: NEGATIVE mg/dL
pH: 6.5 (ref 5.0–8.0)

## 2018-01-15 LAB — DIFFERENTIAL
Basophils Absolute: 0 10*3/uL (ref 0.0–0.1)
Basophils Relative: 0 %
EOS ABS: 0.1 10*3/uL (ref 0.0–0.7)
Eosinophils Relative: 1 %
LYMPHS ABS: 3 10*3/uL (ref 0.7–4.0)
Lymphocytes Relative: 32 %
MONOS PCT: 6 %
Monocytes Absolute: 0.6 10*3/uL (ref 0.1–1.0)
Neutro Abs: 5.6 10*3/uL (ref 1.7–7.7)
Neutrophils Relative %: 61 %

## 2018-01-15 LAB — RAPID URINE DRUG SCREEN, HOSP PERFORMED
Amphetamines: NOT DETECTED
BENZODIAZEPINES: NOT DETECTED
Cocaine: NOT DETECTED
Opiates: NOT DETECTED
Tetrahydrocannabinol: NOT DETECTED

## 2018-01-15 LAB — ETHANOL: Alcohol, Ethyl (B): <10 mg/dL (ref <10)

## 2018-01-15 LAB — CBC
HCT: 42.6 % (ref 36.0–46.0)
HEMOGLOBIN: 14 g/dL (ref 12.0–15.0)
MCH: 27.8 pg (ref 26.0–34.0)
MCHC: 32.9 g/dL (ref 30.0–36.0)
MCV: 84.7 fL (ref 78.0–100.0)
Platelets: 301 10*3/uL (ref 150–400)
RBC: 5.03 MIL/uL (ref 3.87–5.11)
RDW: 16.9 % — ABNORMAL HIGH (ref 11.5–15.5)
WBC: 9.3 10*3/uL (ref 4.0–10.5)

## 2018-01-15 LAB — URINALYSIS, MICROSCOPIC (REFLEX)

## 2018-01-15 LAB — PROTIME-INR
INR: 0.93
Prothrombin Time: 12.4 seconds (ref 11.4–15.2)

## 2018-01-15 LAB — TROPONIN I

## 2018-01-15 LAB — APTT: aPTT: 43 seconds — ABNORMAL HIGH (ref 24–36)

## 2018-01-15 LAB — CBG MONITORING, ED: GLUCOSE-CAPILLARY: 97 mg/dL (ref 70–99)

## 2018-01-15 MED ORDER — MECLIZINE HCL 25 MG PO TABS: 25.0000 mg | ORAL_TABLET | Freq: Three times a day (TID) | ORAL | 0 refills | Status: AC | PRN

## 2018-01-15 MED ORDER — MECLIZINE HCL 25 MG PO TABS
25.0000 mg | ORAL_TABLET | Freq: Once | ORAL | Status: AC
  Administered 2018-01-15: 25 mg via ORAL
  Filled 2018-01-15: qty 1

## 2018-01-15 MED ORDER — ONDANSETRON 4 MG PO TBDP
4.0000 mg | ORAL_TABLET | Freq: Once | ORAL | Status: AC
  Administered 2018-01-15: 4 mg via ORAL
  Filled 2018-01-15: qty 1

## 2018-01-15 MED FILL — MECLIZINE 25 MG TABLET: 25 | 10 days supply | Qty: 30 | Fill #0

## 2018-01-15 NOTE — Discharge Instructions (Signed)
If your dizziness gets worse over the weekend or you are unable to walk you need to return to the emergency room immediately to get an MRI.

## 2018-01-15 NOTE — ED Triage Notes (Signed)
Pt states she woke at 8am-dizzy, nausea-dizziness worse with head movement-denies pain-NAD-to triage in w/c

## 2018-01-15 NOTE — ED Provider Notes (Signed)
[facility] EMERGENCY DEPARTMENT Provider Note   CSN: [id number] Arrival date & time: 01/15/18  1154     History   Chief Complaint Chief Complaint  Patient presents with  . Dizziness    HPI Ashlee Mora is a 62 y.o. female.  Patient is a 62 year old female with a history of COPD, diabetes, hypertension and prior cerebellar stroke presenting today with dizziness that started around 8 AM when she woke up this morning and try to get out of bed.  She states anytime she tries to bend over, stand or walk she feels much more dizzy and feels like she has trouble keeping her balance with walking.  She also feels nauseated with this but states she has not been able to vomit.  If she is sitting still and not moving she does feel better.  She denies any recent head trauma or vision changes.  About 2 weeks ago she was having cough and congestion was diagnosed with a sinus infection and given medication by her PCP.  She states the cough has continued but the congestion has definitely improved. Patient also states that approximately 2 years ago she came in with some dizziness and vision changes and was diagnosed with an inner ear infection but what ended up happening was when her symptoms did not improve she had an MRI which showed a cerebellar stroke.  Patient states they were not able to find the cause of the stroke other than her underlying medical problems.  She is not taking any type of anticoagulation at this time.  The history is provided by the patient.  Dizziness  Quality:  Imbalance and head spinning Severity:  Severe Onset quality:  Sudden Duration:  6 hours Timing:  Constant Progression:  Unchanged Chronicity:  New Context comment:  Started when she sat up and try to get out of bed this morning Relieved by:  Being still Worsened by:  Eye movement, sitting upright, turning head and standing up Ineffective treatments:  None tried Associated symptoms: nausea   Associated  symptoms: no chest pain, no diarrhea, no headaches, no palpitations, no shortness of breath, no syncope, no vision changes, no vomiting and no weakness     Past Medical History:  Diagnosis Date  . Arthritis   . Asthma   . COPD (chronic obstructive pulmonary disease) (HCC)   . Diabetes mellitus   . Hypertension   . Stroke [facility])     There are no active problems to display for this patient.   Past Surgical History:  Procedure Laterality Date  . ABDOMINAL HYSTERECTOMY       OB History   None      Home Medications    Prior to Admission medications   Medication Sig Start Date End Date Taking? Authorizing Provider  albuterol (PROVENTIL HFA;VENTOLIN HFA) 108 (90 BASE) MCG/ACT inhaler Inhale 2 puffs into the lungs every 6 (six) hours as needed. For shortness of breath and wheezing     [provider]  Amlodipine Besylate-Valsartan (EXFORGE PO) Take 1 tablet by mouth daily.      [provider]  aspirin EC 81 MG tablet Take 81 mg by mouth daily.      [provider]  cholecalciferol (VITAMIN D) 1000 UNITS tablet Take 1,000 Units by mouth daily.      [provider]  cyclobenzaprine (FLEXERIL) 10 MG tablet Take 1 tablet (10 mg total) by mouth 2 (two) times daily as needed for muscle spasms. 01/28/17   Crista Curb  Duo, MD  diclofenac sodium (VOLTAREN) 1 % GEL Apply 2 g topically 4 (four) times daily. 01/28/17   Lavera GuiseLiu, Dana Duo, MD  Fluticasone-Salmeterol (ADVAIR DISKUS IN) Inhale 1 puff into the lungs 2 (two) times daily.      [provider]  hydrochlorothiazide (HYDRODIURIL) 25 MG tablet Take 25 mg by mouth daily.      [provider]  HYDROcodone-acetaminophen (NORCO/VICODIN) 5-325 MG per tablet Take 1 tablet by mouth every 6 (six) hours as needed. 11/16/14   Horton, Mayer Maskerourtney F, MD  hydrOXYzine (ATARAX/VISTARIL) 25 MG tablet Take 1 tablet (25 mg total) by mouth every 6 (six) hours. 01/22/15   Ivery QualeBryant, Hobson, PA-C  meclizine (ANTIVERT) 25 MG  tablet Take 1 tablet (25 mg total) by mouth 3 (three) times daily as needed for dizziness. 04/17/16   Melene PlanFloyd, Dan, DO  meloxicam (MOBIC) 7.5 MG tablet Take 1 tablet (7.5 mg total) by mouth daily. 01/22/15   Ivery QualeBryant, Hobson, PA-C  metFORMIN (GLUCOPHAGE) 500 MG tablet Take 500 mg by mouth 2 (two) times daily with a meal.      [provider]  omeprazole (PRILOSEC) 20 MG capsule Take 20 mg by mouth daily.    [provider]  Rosuvastatin Calcium (CRESTOR PO) Take 1 tablet by mouth daily.      [provider]  simvastatin (ZOCOR) 40 MG tablet Take 40 mg by mouth daily.    [provider]  tiotropium (SPIRIVA) 18 MCG inhalation capsule Place 18 mcg into inhaler and inhale daily.      [provider]    Family History No family history on file.  Social History Social History   Tobacco Use  . Smoking status: Current Every Day Smoker    Packs/day: 0.50    Types: Cigarettes  . Smokeless tobacco: Never Used  Substance Use Topics  . Alcohol use: No  . Drug use: No     Allergies   Patient has no known allergies.   Review of Systems Review of Systems  Respiratory: Negative for shortness of breath.   Cardiovascular: Negative for chest pain, palpitations and syncope.  Gastrointestinal: Positive for nausea. Negative for diarrhea and vomiting.  Neurological: Positive for dizziness. Negative for weakness and headaches.  All other systems reviewed and are negative.    Physical Exam Updated Vital Signs BP (!) 159/83 (BP Location: Left Arm)   Pulse 68   Temp 97.8 F (36.6 C) (Oral)   Resp 16   Ht 5\' 1"  (1.549 m)   Wt 91.6 kg (202 lb)   SpO2 100%   BMI 38.17 kg/m   Physical Exam  Constitutional: She is oriented to person, place, and time. She appears well-developed and well-nourished. No distress.  HENT:  Head: Normocephalic and atraumatic.  Left Ear: Tympanic membrane normal.  Cerumen impaction in the right ear canal  Eyes: Pupils are  equal, round, and reactive to light. EOM are normal.  Cardiovascular: Normal rate, regular rhythm, normal heart sounds and intact distal pulses. Exam reveals no friction rub.  No murmur heard. Pulmonary/Chest: Effort normal and breath sounds normal. She has no wheezes. She has no rales.  Abdominal: Soft. Bowel sounds are normal. She exhibits no distension. There is no tenderness. There is no rebound and no guarding.  Musculoskeletal: Normal range of motion. She exhibits no tenderness.  No edema  Neurological: She is alert and oriented to person, place, and time. She has normal strength. No cranial nerve deficit or sensory deficit. Coordination normal.  Normal heel-to-shin testing bilaterally.  Finger-to-nose testing within normal limits.  No visual field cuts noted.  No pronator drift on either extremity.  Skin: Skin is warm and dry. No rash noted.  Psychiatric: She has a normal mood and affect. Her behavior is normal.  Nursing note and vitals reviewed.    ED Treatments / Results  Labs (all labs ordered are listed, but only abnormal results are displayed) Labs Reviewed  APTT - Abnormal; Notable for the following components:      Result Value   aPTT 43 (*)    All other components within normal limits  CBC - Abnormal; Notable for the following components:   RDW 16.9 (*)    All other components within normal limits  COMPREHENSIVE METABOLIC PANEL - Abnormal; Notable for the following components:   Potassium 3.2 (*)    Glucose, Bld 136 (*)    Calcium 8.7 (*)    All other components within normal limits  RAPID URINE DRUG SCREEN, HOSP PERFORMED - Abnormal; Notable for the following components:   Barbiturates   (*)    Value: Result not available. Reagent lot number recalled by manufacturer.   All other components within normal limits  URINALYSIS, ROUTINE W REFLEX MICROSCOPIC - Abnormal; Notable for the following components:   Specific Gravity, Urine <1.005 (*)    Glucose, UA >=500 (*)      All other components within normal limits  URINALYSIS, MICROSCOPIC (REFLEX) - Abnormal; Notable for the following components:   Bacteria, UA FEW (*)    All other components within normal limits  ETHANOL  PROTIME-INR  DIFFERENTIAL  TROPONIN I  CBG MONITORING, ED  CBG MONITORING, ED    EKG None  ED ECG REPORT   Date: 01/15/2018  Rate: 68  Rhythm: normal sinus rhythm  QRS Axis: normal  Intervals: normal  ST/T Wave abnormalities: normal  Conduction Disutrbances:none  Narrative Interpretation:   Old EKG Reviewed: unchanged  I have personally reviewed the EKG tracing and agree with the computerized printout as noted.   Radiology Ct Head Wo Contrast  Result Date: 01/15/2018 CLINICAL DATA:  Initial evaluation for acute dizziness, vertigo. EXAM: CT HEAD WITHOUT CONTRAST TECHNIQUE: Contiguous axial images were obtained from the base of the skull through the vertex without intravenous contrast. COMPARISON:  Prior MRI from 04/19/2016 FINDINGS: Brain: Generalized age-related cerebral atrophy with mild chronic small vessel ischemic disease. No acute intracranial hemorrhage. No acute large vessel territory infarct. No mass lesion, midline shift or mass effect. No hydrocephalus. No extra-axial fluid collection. Vascular: No hyperdense vessel. Scattered vascular calcifications noted within the carotid siphons. Skull: Scalp soft tissues and calvarium within normal limits. Sinuses/Orbits: Globes and orbital soft tissues within normal limits. Paranasal sinuses and mastoid air cells are clear. Other: None. IMPRESSION: 1. No acute intracranial abnormality. 2. Age-related cerebral atrophy with mild chronic small vessel ischemic disease. Electronically Signed   By: Rise Mu M.D.   On: 01/15/2018 14:04    Procedures Procedures (including critical care time)  Medications Ordered in ED Medications  meclizine (ANTIVERT) tablet 25 mg (has no administration in time range)  ondansetron  (ZOFRAN-ODT) disintegrating tablet 4 mg (has no administration in time range)     Initial Impression / Assessment and Plan / ED Course  I have reviewed the triage vital signs and the nursing notes.  Pertinent labs & imaging results that were available during my care of the patient were reviewed by me and considered in my medical decision making (see chart for  details).     62 year old female with multiple medical problems presenting today with dizziness which by history sounds most like peripheral vertigo.  It is worse when she moves her head she has a spinning sensation with nausea.  When she walks she feels off balance but is not frankly ataxic.  Everything started when she tried to get out of bed this morning.  However patient in the past approximately 2 years ago had a cerebellar infarct.  She has no cerebellar findings on exam.  She was treated symptomatically with Zofran and meclizine.  Stroke work-up initiated.  3:02 PM Patient's labs are within normal limits.  CT is negative.  After meclizine patient states she feels great.  She is able to walk around the department and bend over without feeling dizzy.  Discussed with the patient that this does not 100% rule out having a posterior stroke but it is less likely.  Encourage the patient to return immediately if the dizziness does not go away or she has difficulty walking. She is following up with her PCP on Monday.  Final Clinical Impressions(s) / ED Diagnoses   Final diagnoses:  Vertigo    ED Discharge Orders        Ordered    meclizine (ANTIVERT) 25 MG tablet  3 times daily PRN     01/15/18 1452       Gwyneth Sprout, MD 01/15/18 1503

## 2020-06-01 IMAGING — CT CT HEAD W/O CM
3 series · 14 of 47 positions shown, 16 images · non-contrast
Comparison: Prior MRI from 04/19/2016

CLINICAL DATA: Initial evaluation for acute dizziness, vertigo.

EXAM:
CT HEAD WITHOUT CONTRAST
TECHNIQUE: Contiguous axial images were obtained from the base of the skull
through the vertex without intravenous contrast.

[Series 2: head wo · axial · 0.44mm/px · z∈[+496,+622]mm · 8 of 30 slices shown, 10 images]
[im 3/30  brain]
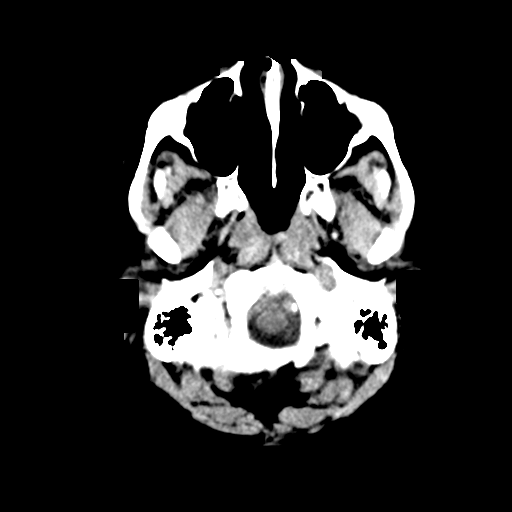
[im 3/30  bone]
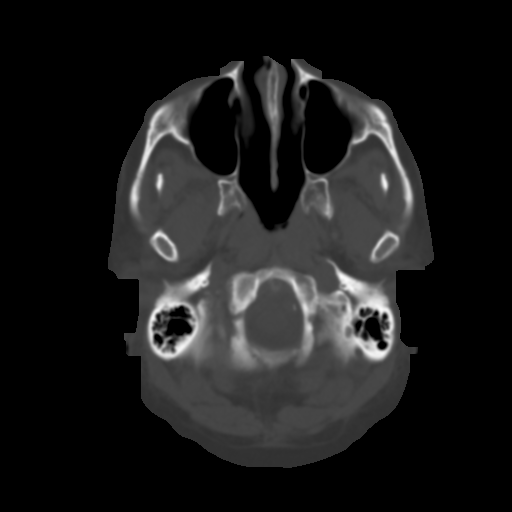
[im 7/30  brain]
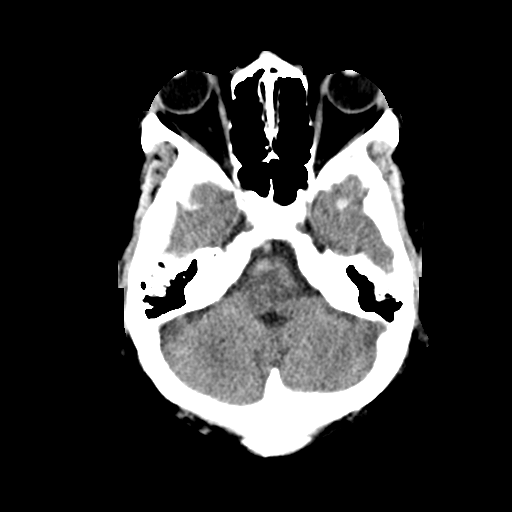
[im 10/30  brain]
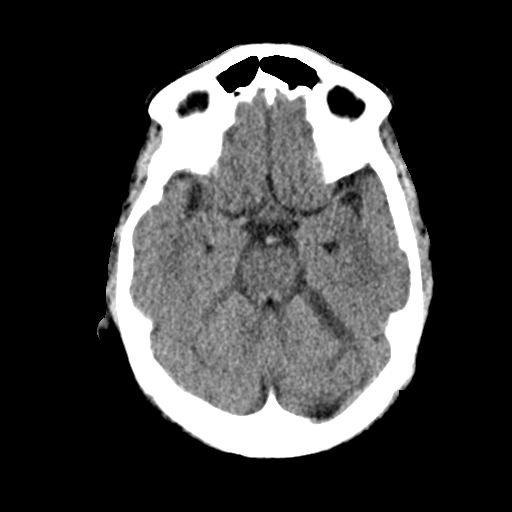
[im 14/30  brain]
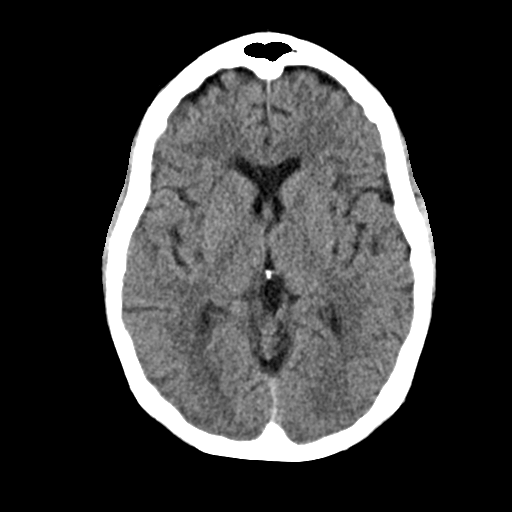
[im 17/30  brain]
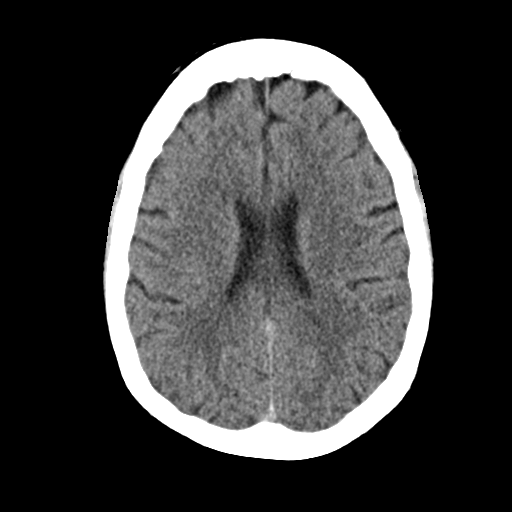
[im 17/30  bone]
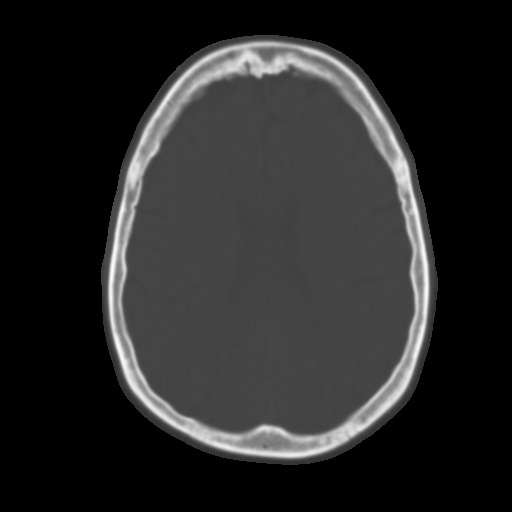
[im 21/30  brain]
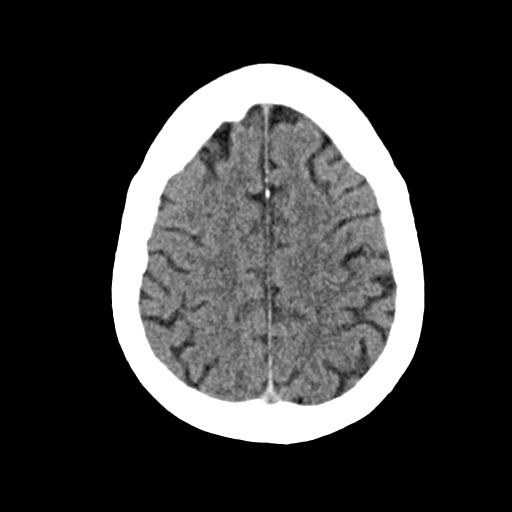
[im 24/30  brain]
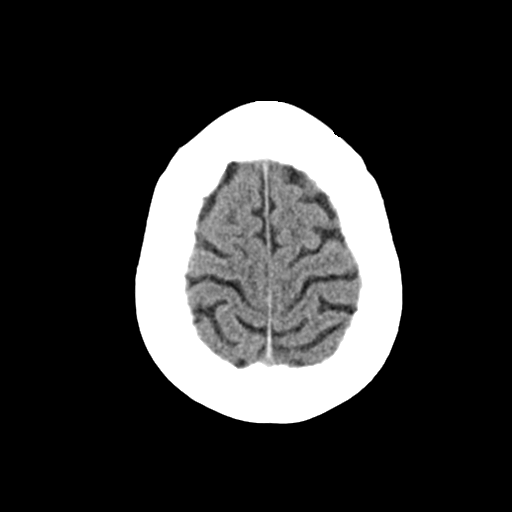
[im 28/30  brain]
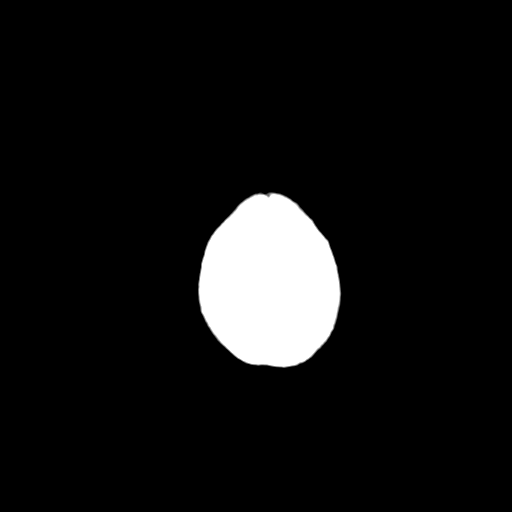

[Series 4: coronal soft · coronal · 0.29mm/px · 3 of 68 slices shown]
[im 23/68  brain]
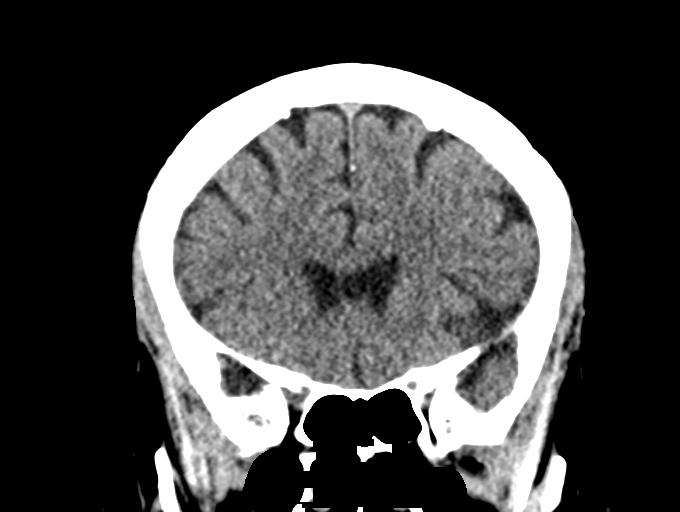
[im 30/68  brain]
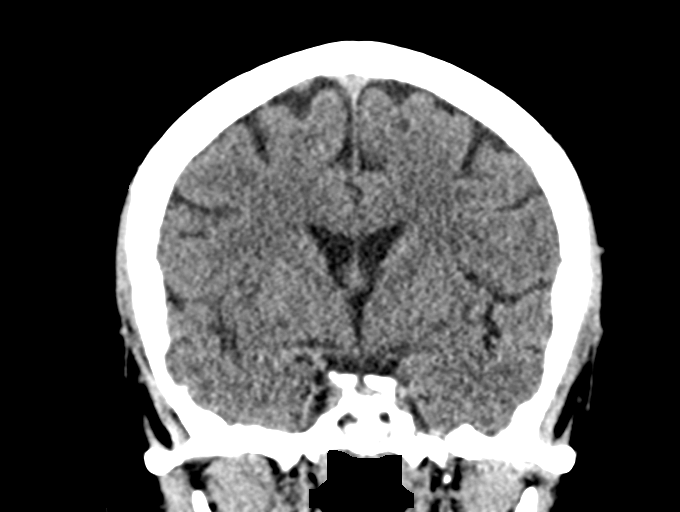
[im 38/68  brain]
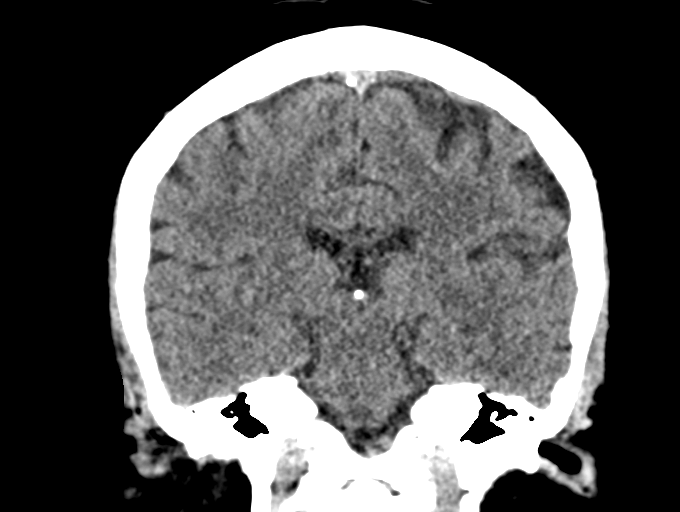

[Series 5: sag soft · sagittal · 0.29mm/px · 3 of 67 slices shown]
[im 23/67  brain]
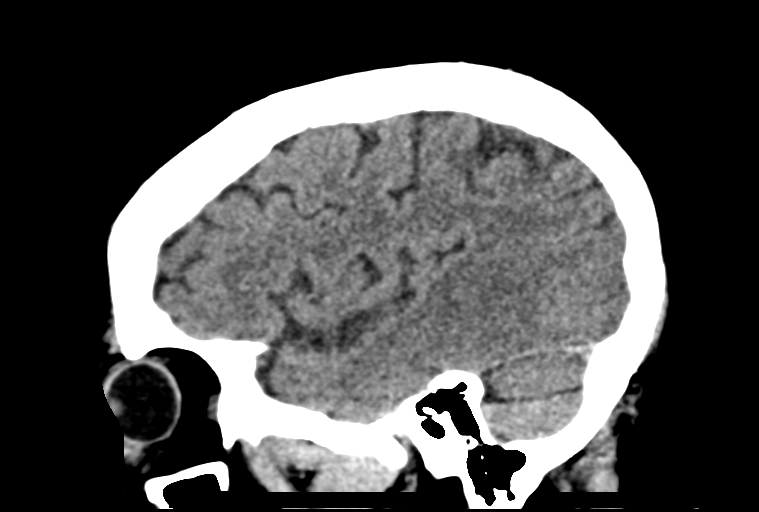
[im 34/67  brain]
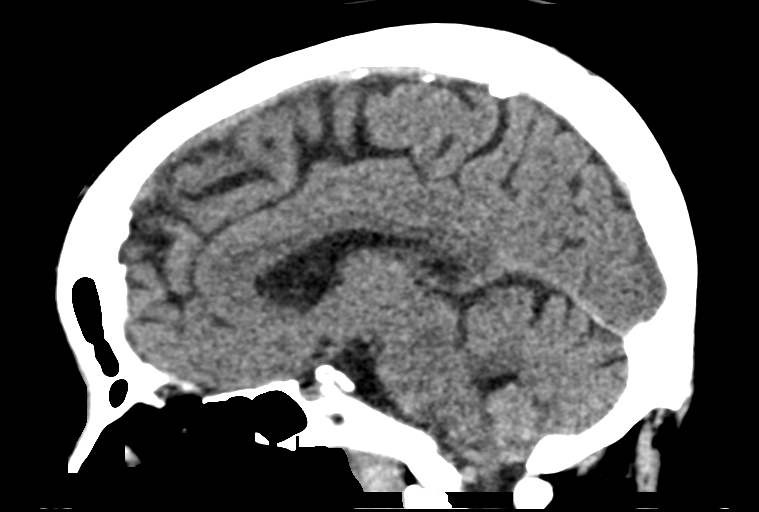
[im 45/67  brain]
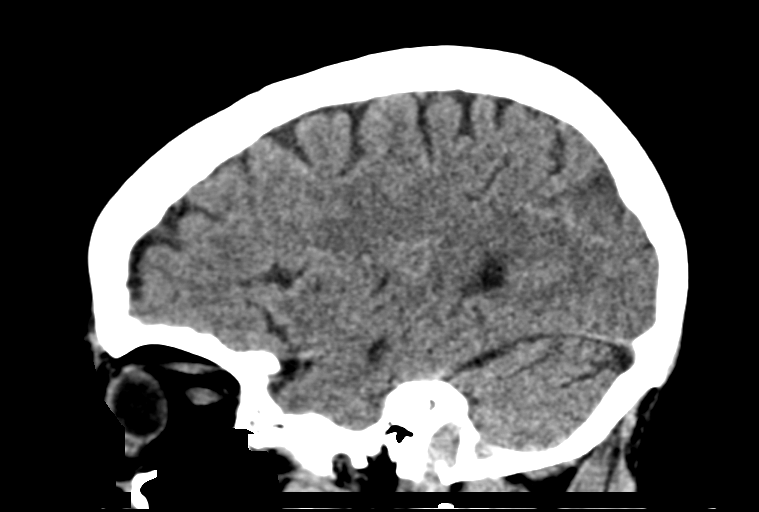

[14 of 47 positions shown; findings below may reference images not displayed]

FINDINGS: Brain: Generalized age-related cerebral atrophy with mild chronic
small vessel ischemic disease. No acute intracranial hemorrhage. No
acute large vessel territory infarct. No mass lesion, midline shift
or mass effect. No hydrocephalus. No extra-axial fluid collection.

Vascular: No hyperdense vessel. Scattered vascular calcifications
noted within the carotid siphons.

Skull: Scalp soft tissues and calvarium within normal limits.

Sinuses/Orbits: Globes and orbital soft tissues within normal
limits. Paranasal sinuses and mastoid air cells are clear.

Other: None.
IMPRESSION: 1. No acute intracranial abnormality.
2. Age-related cerebral atrophy with mild chronic small vessel
ischemic disease.

## 2022-08-06 ENCOUNTER — Emergency Department (HOSPITAL_BASED_OUTPATIENT_CLINIC_OR_DEPARTMENT_OTHER): Payer: Medicaid Other

## 2022-08-06 ENCOUNTER — Emergency Department (HOSPITAL_BASED_OUTPATIENT_CLINIC_OR_DEPARTMENT_OTHER)
Admission: EM | Admit: 2022-08-06 | Discharge: 2022-08-06 | Disposition: A | Discharge status: Home / Self Care | Payer: Medicaid Other | Attending: Emergency Medicine | Admitting: Emergency Medicine

## 2022-08-06 ENCOUNTER — Other Ambulatory Visit: Payer: Self-pay

## 2022-08-06 ENCOUNTER — Encounter (HOSPITAL_BASED_OUTPATIENT_CLINIC_OR_DEPARTMENT_OTHER): Payer: Self-pay

## 2022-08-06 DIAGNOSIS — R141 Gas pain: Secondary | ICD-10-CM

## 2022-08-06 DIAGNOSIS — E119 Type 2 diabetes mellitus without complications: Secondary | ICD-10-CM | POA: Diagnosis not present

## 2022-08-06 DIAGNOSIS — Z7984 Long term (current) use of oral hypoglycemic drugs: Secondary | ICD-10-CM | POA: Insufficient documentation

## 2022-08-06 DIAGNOSIS — Z7982 Long term (current) use of aspirin: Secondary | ICD-10-CM | POA: Diagnosis not present

## 2022-08-06 DIAGNOSIS — R109 Unspecified abdominal pain: Secondary | ICD-10-CM | POA: Diagnosis present

## 2022-08-06 DIAGNOSIS — K5900 Constipation, unspecified: Secondary | ICD-10-CM | POA: Diagnosis not present

## 2022-08-06 DIAGNOSIS — K449 Diaphragmatic hernia without obstruction or gangrene: Secondary | ICD-10-CM | POA: Diagnosis not present

## 2022-08-06 LAB — CBC
HCT: 45.6 % (ref 36.0–46.0)
Hemoglobin: 14.6 g/dL (ref 12.0–15.0)
MCH: 27.5 pg (ref 26.0–34.0)
MCHC: 32 g/dL (ref 30.0–36.0)
MCV: 86 fL (ref 80.0–100.0)
Platelets: 299 10*3/uL (ref 150–400)
RBC: 5.3 MIL/uL — ABNORMAL HIGH (ref 3.87–5.11)
RDW: 14.5 % (ref 11.5–15.5)
WBC: 12.7 10*3/uL — ABNORMAL HIGH (ref 4.0–10.5)
nRBC: 0 % (ref 0.0–0.2)

## 2022-08-06 LAB — TROPONIN I (HIGH SENSITIVITY)
Troponin I (High Sensitivity): 8 ng/L (ref <18)
Troponin I (High Sensitivity): 10 ng/L (ref <18)

## 2022-08-06 LAB — BASIC METABOLIC PANEL
Anion gap: 9 (ref 5–15)
BUN: 17 mg/dL (ref 8–23)
CO2: 25 mmol/L (ref 22–32)
Calcium: 9.5 mg/dL (ref 8.9–10.3)
Chloride: 103 mmol/L (ref 98–111)
Creatinine, Ser: 1 mg/dL (ref 0.44–1.00)
GFR, Estimated: >60 mL/min (ref >60)
Glucose, Bld: 309 mg/dL — ABNORMAL HIGH (ref 70–99)
Potassium: 3.2 mmol/L — ABNORMAL LOW (ref 3.5–5.1)
Sodium: 137 mmol/L (ref 135–145)

## 2022-08-06 MED ORDER — IOHEXOL 300 MG/ML  SOLN
100.0000 mL | Freq: Once | INTRAMUSCULAR | Status: AC | PRN
  Administered 2022-08-06: 100 mL via INTRAVENOUS

## 2022-08-06 MED ORDER — SIMETHICONE 80 MG PO CHEW: 80.0000 mg | CHEWABLE_TABLET | Freq: Four times a day (QID) | ORAL | 0 refills | Status: AC | PRN

## 2022-08-06 MED ORDER — SODIUM CHLORIDE 0.9 % IV BOLUS
1000.0000 mL | Freq: Once | INTRAVENOUS | Status: AC
  Administered 2022-08-06: 1000 mL via INTRAVENOUS

## 2022-08-06 MED ORDER — SIMETHICONE 40 MG/0.6ML PO SUSP
160.0000 mg | Freq: Once | ORAL | Status: AC
  Administered 2022-08-06: 160 mg via ORAL
  Filled 2022-08-06: qty 2.4

## 2022-08-06 NOTE — Discharge Instructions (Addendum)
Please follow-up with your primary care doctor, make sure you are taking MiraLAX daily for the next 7 days to help with your constipation.  If you start and not be able to pass gas, have nausea or vomiting, please return to the ER.  Additionally you are found to have a hiatal hernia, this may be a cause of some of your epigastric discomfort, you may need to see a general surgeon, please follow-up with your primary care in regards to this.  You can take Gas-X which has been prescribed to you for your gas pain, and belching.

## 2022-08-06 NOTE — ED Triage Notes (Addendum)
Pt presents with complaint of constipation with pressure in upper abdomen/chest pressure. Frequent belching. Last BM Monday. Unable to pass stool on multiple attempts. Continually belching intriage

## 2022-08-06 NOTE — ED Provider Notes (Signed)
[place] EMERGENCY DEPARTMENT AT [facility] Provider Note   CSN: [id number] Arrival date & time: 08/06/22  1853     History  Chief Complaint  Patient presents with   Chest Pain   Constipation    Ashlee Mora is a 67 y.o. female, history of diabetes, who presents to the ED secondary to nausea, abdominal discomfort, and persistent belching for the last 3 days.  She states she has reduced gas, but is unable to have a bowel movement.  Has not a bowel movement for the last 3 days and has taken laxatives without any relief.  She states that she feels like her abdomen is just very swollen and tender.  Denies any history of abdominal surgeries.  Also reports some upper abdomen/chest pressure.  Denies any shortness of breath.  Per patient pain is moderate, and "just uncomfortable."     Home Medications Prior to Admission medications   Medication Sig Start Date End Date Taking? Authorizing Provider  simethicone (GAS-X) 80 MG chewable tablet Chew 1 tablet (80 mg total) by mouth every 6 (six) hours as needed for flatulence. 08/06/22  Yes Lavarr President L, PA  albuterol (PROVENTIL HFA;VENTOLIN HFA) 108 (90 BASE) MCG/ACT inhaler Inhale 2 puffs into the lungs every 6 (six) hours as needed. For shortness of breath and wheezing     [provider]  Amlodipine Besylate-Valsartan (EXFORGE PO) Take 1 tablet by mouth daily.      [provider]  aspirin EC 81 MG tablet Take 81 mg by mouth daily.      [provider]  cholecalciferol (VITAMIN D) 1000 UNITS tablet Take 1,000 Units by mouth daily.      [provider]  cyclobenzaprine (FLEXERIL) 10 MG tablet Take 1 tablet (10 mg total) by mouth 2 (two) times daily as needed for muscle spasms. 01/28/17   Lavera Guise, MD  diclofenac sodium (VOLTAREN) 1 % GEL Apply 2 g topically 4 (four) times daily. 01/28/17   Lavera Guise, MD  Fluticasone-Salmeterol (ADVAIR DISKUS IN) Inhale 1 puff into the lungs 2 (two) times  daily.      [provider]  hydrochlorothiazide (HYDRODIURIL) 25 MG tablet Take 25 mg by mouth daily.      [provider]  HYDROcodone-acetaminophen (NORCO/VICODIN) 5-325 MG per tablet Take 1 tablet by mouth every 6 (six) hours as needed. 11/16/14   Horton, Mayer Masker, MD  hydrOXYzine (ATARAX/VISTARIL) 25 MG tablet Take 1 tablet (25 mg total) by mouth every 6 (six) hours. 01/22/15   Ivery Quale, PA-C  meclizine (ANTIVERT) 25 MG tablet Take 1 tablet (25 mg total) by mouth 3 (three) times daily as needed for dizziness. 01/15/18   Gwyneth Sprout, MD  meloxicam (MOBIC) 7.5 MG tablet Take 1 tablet (7.5 mg total) by mouth daily. 01/22/15   Ivery Quale, PA-C  metFORMIN (GLUCOPHAGE) 500 MG tablet Take 500 mg by mouth 2 (two) times daily with a meal.      [provider]  omeprazole (PRILOSEC) 20 MG capsule Take 20 mg by mouth daily.    [provider]  Rosuvastatin Calcium (CRESTOR PO) Take 1 tablet by mouth daily.      [provider]  simvastatin (ZOCOR) 40 MG tablet Take 40 mg by mouth daily.    [provider]  tiotropium (SPIRIVA) 18 MCG inhalation capsule Place 18 mcg into inhaler and inhale daily.      [provider]      Allergies  Patient has no known allergies.    Review of Systems   Review of Systems  Cardiovascular:  Positive for chest pain.  Gastrointestinal:  Positive for abdominal pain, constipation and nausea. Negative for vomiting.    Physical Exam Updated Vital Signs BP 125/67 (BP Location: Left Arm)   Pulse 99   Temp 98.6 F (37 C)   Resp 20   Ht 5\' 1"  (1.549 m)   Wt 94.8 kg   SpO2 95%   BMI 39.49 kg/m  Physical Exam Vitals and nursing note reviewed.  Constitutional:      General: She is not in acute distress.    Appearance: She is well-developed.  HENT:     Head: Normocephalic and atraumatic.  Eyes:     Conjunctiva/sclera: Conjunctivae normal.  Cardiovascular:     Rate and Rhythm: Normal  rate and regular rhythm.     Heart sounds: No murmur heard. Pulmonary:     Effort: Pulmonary effort is normal. No respiratory distress.     Breath sounds: Normal breath sounds.  Abdominal:     Palpations: Abdomen is soft.     Tenderness: There is generalized abdominal tenderness. There is no guarding or rebound.  Musculoskeletal:        General: No swelling.     Cervical back: Neck supple.  Skin:    General: Skin is warm and dry.     Capillary Refill: Capillary refill takes less than 2 seconds.  Neurological:     Mental Status: She is alert.  Psychiatric:        Mood and Affect: Mood normal.     ED Results / Procedures / Treatments   Labs (all labs ordered are listed, but only abnormal results are displayed) Labs Reviewed  BASIC METABOLIC PANEL - Abnormal; Notable for the following components:      Result Value   Potassium 3.2 (*)    Glucose, Bld 309 (*)    All other components within normal limits  CBC - Abnormal; Notable for the following components:   WBC 12.7 (*)    RBC 5.30 (*)    All other components within normal limits  TROPONIN I (HIGH SENSITIVITY)  TROPONIN I (HIGH SENSITIVITY)    EKG None  Radiology CT ABDOMEN PELVIS W CONTRAST  Result Date: 08/06/2022 CLINICAL DATA:  Acute abdominal pain EXAM: CT ABDOMEN AND PELVIS WITH CONTRAST TECHNIQUE: Multidetector CT imaging of the abdomen and pelvis was performed using the standard protocol following bolus administration of intravenous contrast. RADIATION DOSE REDUCTION: This exam was performed according to the departmental dose-optimization program which includes automated exposure control, adjustment of the mA and/or kV according to patient size and/or use of iterative reconstruction technique. CONTRAST:  15mL OMNIPAQUE IOHEXOL 300 MG/ML  SOLN COMPARISON:  Report only CT abdomen and pelvis 01/27/2013 FINDINGS: Lower chest: No acute abnormality. Hepatobiliary: No focal liver abnormality is seen. No gallstones,  gallbladder wall thickening, or biliary dilatation. Pancreas: Unremarkable. No pancreatic ductal dilatation or surrounding inflammatory changes. Spleen: Normal in size without focal abnormality. Adrenals/Urinary Tract: Adrenal glands are unremarkable. Kidneys are normal, without renal calculi, focal lesion, or hydronephrosis. Bladder is unremarkable. Stomach/Bowel: There is a Chrishawn Kring hiatal hernia. Stomach is otherwise within normal limits. Appendix appears normal. No evidence of bowel wall thickening, distention, or inflammatory changes. Vascular/Lymphatic: Aortic atherosclerosis. No enlarged abdominal or pelvic lymph nodes. Reproductive: Status post hysterectomy. No adnexal masses. Other: No abdominal wall hernia or abnormality. No abdominopelvic ascites. There are degenerative changes Musculoskeletal: There are degenerative  changes at L5-S1. IMPRESSION: 1. No acute localizing process in the abdomen or pelvis. 2. Camerin Ladouceur hiatal hernia. Aortic Atherosclerosis (ICD10-I70.0). Electronically Signed   By: Ronney Asters M.D.   On: 08/06/2022 22:30   DG Chest 2 View  Result Date: 08/06/2022 CLINICAL DATA:  Chest pressure. EXAM: CHEST - 2 VIEW COMPARISON:  April 07, 2017 FINDINGS: The heart size and mediastinal contours are within normal limits. Both lungs are clear. The visualized skeletal structures are unremarkable. IMPRESSION: No active cardiopulmonary disease. Electronically Signed   By: Virgina Norfolk M.D.   On: 08/06/2022 19:37    Procedures Procedures    Medications Ordered in ED Medications  sodium chloride 0.9 % bolus 1,000 mL (0 mLs Intravenous Stopped 08/06/22 2242)  simethicone (MYLICON) 40 SP/2.3RA suspension 160 mg (160 mg Oral Given 08/06/22 2108)  iohexol (OMNIPAQUE) 300 MG/ML solution 100 mL (100 mLs Intravenous Contrast Given 08/06/22 2131)    ED Course/ Medical Decision Making/ A&P                           Medical Decision Making Patient is a 67 year old female, here for abdominal  discomfort, and constant burping for the last day.  She also reports no bowel movement for the last 3 days.  Is on Linzess, but has not been taking her MiraLAX.  She is tender to the palpation of her abdomen, and reports reduced gas, we will obtain a CT abdomen pelvis to rule out an obstruction, and labs for further evaluation.  Additionally given that she is having some epigastric discomfort, we will obtain troponins, and an EKG for further evaluation to rule out cardiac etiology.  Denies any urinary symptoms.  Amount and/or Complexity of Data Reviewed Labs: ordered.    Details: Mild leukocytosis, troponin within normal limits. Radiology: ordered.    Details: CT abdomen pelvis shows only Caly Pellum hiatal hernia, no other acute process. Discussion of management or test interpretation with external provider(s): Discussed with patient, pressure in upper abdomen/chest may be secondary to hiatal hernia, she does endorse having a sour taste in her mouth at times, but is on a PPI twice a day, encouraged her to follow-up with her primary care doctor, to discuss possible referral for surgery if it is very symptomatic to her.  Keflex was sent to the pharmacy to help with her belching and gas retention.  She is feeling better on discharge, and she voiced return precautions and understanding.  Risk OTC drugs. Prescription drug management.    Final Clinical Impression(s) / ED Diagnoses Final diagnoses:  Hiatal hernia  Gas pain  Constipation, unspecified constipation type    Rx / DC Orders ED Discharge Orders          Ordered    simethicone (GAS-X) 80 MG chewable tablet  Every 6 hours PRN        08/06/22 2241              Jeison Delpilar Carlean Jews, PA 08/06/22 2343    Deno Etienne, DO 08/07/22 1206

## 2023-12-12 ENCOUNTER — Emergency Department (HOSPITAL_BASED_OUTPATIENT_CLINIC_OR_DEPARTMENT_OTHER)
Admission: EM | Admit: 2023-12-12 | Discharge: 2023-12-12 | Disposition: A | Discharge status: Home / Self Care | Attending: Emergency Medicine | Admitting: Emergency Medicine

## 2023-12-12 ENCOUNTER — Encounter (HOSPITAL_BASED_OUTPATIENT_CLINIC_OR_DEPARTMENT_OTHER): Payer: Self-pay | Admitting: Emergency Medicine

## 2023-12-12 DIAGNOSIS — L904 Acrodermatitis chronica atrophicans: Secondary | ICD-10-CM | POA: Insufficient documentation

## 2023-12-12 DIAGNOSIS — N904 Leukoplakia of vulva: Secondary | ICD-10-CM

## 2023-12-12 DIAGNOSIS — E119 Type 2 diabetes mellitus without complications: Secondary | ICD-10-CM | POA: Diagnosis not present

## 2023-12-12 DIAGNOSIS — N898 Other specified noninflammatory disorders of vagina: Secondary | ICD-10-CM | POA: Diagnosis present

## 2023-12-12 DIAGNOSIS — J4489 Other specified chronic obstructive pulmonary disease: Secondary | ICD-10-CM | POA: Diagnosis not present

## 2023-12-12 LAB — URINALYSIS, MICROSCOPIC (REFLEX)
Bacteria, UA: NONE SEEN
RBC / HPF: NONE SEEN RBC/hpf (ref 0–5)

## 2023-12-12 LAB — URINALYSIS, ROUTINE W REFLEX MICROSCOPIC
Bilirubin Urine: NEGATIVE
Glucose, UA: >=500 mg/dL — AB
Hgb urine dipstick: NEGATIVE
Ketones, ur: NEGATIVE mg/dL
Leukocytes,Ua: NEGATIVE
Nitrite: NEGATIVE
Protein, ur: NEGATIVE mg/dL
Specific Gravity, Urine: 1.01 (ref 1.005–1.030)
pH: 6 (ref 5.0–8.0)

## 2023-12-12 LAB — WET PREP, GENITAL
Clue Cells Wet Prep HPF POC: NONE SEEN
Sperm: NONE SEEN
Trich, Wet Prep: NONE SEEN
WBC, Wet Prep HPF POC: <10 (ref <10)
Yeast Wet Prep HPF POC: NONE SEEN

## 2023-12-12 MED ORDER — CLOBETASOL PROPIONATE 0.05 % EX OINT: 1.0000 | TOPICAL_OINTMENT | Freq: Two times a day (BID) | CUTANEOUS | 0 refills | Status: AC

## 2023-12-12 NOTE — ED Triage Notes (Signed)
 Pt with vaginal irritation and itching since Wed; denies discharge

## 2023-12-12 NOTE — ED Provider Notes (Signed)
 [place] [facility]MENT AT [facility] Provider Note   CSN: [id number] Arrival date & time: 12/12/23  1511     History Chief Complaint  Patient presents with   Vaginal Itching    Ashlee Mora is a 68 y.o. female.  Patient with past history significant for diabetes, asthma, COPD presents emergency department with concerns of vaginal itching.  Reports has been ongoing for about 1 week.  She denies any obvious hematuria or dysuria but does report some burning when the urine hits her skin.  She states that she is planning on being seen by dermatologist next year for this.  She states that she has had to use topical creams in the past for similar symptoms and had been helpful at that time but states she is out of this cream.  She is unsure what this medication was.   Vaginal Itching       Home Medications Prior to Admission medications   Medication Sig Start Date End Date Taking? Authorizing Provider  clobetasol ointment (TEMOVATE) 0.05 % Apply 1 Application topically 2 (two) times daily. 12/12/23  Yes Tareq Dwan A, PA-C  albuterol (PROVENTIL HFA;VENTOLIN HFA) 108 (90 BASE) MCG/ACT inhaler Inhale 2 puffs into the lungs every 6 (six) hours as needed. For shortness of breath and wheezing     [provider]  Amlodipine Besylate-Valsartan (EXFORGE PO) Take 1 tablet by mouth daily.      [provider]  aspirin EC 81 MG tablet Take 81 mg by mouth daily.      [provider]  cholecalciferol (VITAMIN D) 1000 UNITS tablet Take 1,000 Units by mouth daily.      [provider]  cyclobenzaprine  (FLEXERIL ) 10 MG tablet Take 1 tablet (10 mg total) by mouth 2 (two) times daily as needed for muscle spasms. 01/28/17   Liu, Dana Duo, MD  diclofenac  sodium (VOLTAREN ) 1 % GEL Apply 2 g topically 4 (four) times daily. 01/28/17   Raymundo Calk, MD  Fluticasone-Salmeterol (ADVAIR DISKUS IN) Inhale 1 puff into the lungs 2 (two) times daily.       [provider]  hydrochlorothiazide (HYDRODIURIL) 25 MG tablet Take 25 mg by mouth daily.      [provider]  HYDROcodone -acetaminophen  (NORCO/VICODIN) 5-325 MG per tablet Take 1 tablet by mouth every 6 (six) hours as needed. 11/16/14   Horton, Vonzella Guernsey, MD  hydrOXYzine  (ATARAX /VISTARIL ) 25 MG tablet Take 1 tablet (25 mg total) by mouth every 6 (six) hours. 01/22/15   Venson Ginger, PA-C  meclizine  (ANTIVERT ) 25 MG tablet Take 1 tablet (25 mg total) by mouth 3 (three) times daily as needed for dizziness. 01/15/18   Almond Army, MD  meloxicam  (MOBIC ) 7.5 MG tablet Take 1 tablet (7.5 mg total) by mouth daily. 01/22/15   Venson Ginger, PA-C  metFORMIN (GLUCOPHAGE) 500 MG tablet Take 500 mg by mouth 2 (two) times daily with a meal.      [provider]  omeprazole (PRILOSEC) 20 MG capsule Take 20 mg by mouth daily.    [provider]  Rosuvastatin Calcium (CRESTOR PO) Take 1 tablet by mouth daily.      [provider]  simethicone  (GAS-X) 80 MG chewable tablet Chew 1 tablet (80 mg total) by mouth every 6 (six) hours as needed for flatulence. 08/06/22   Small, Brooke L, PA  simvastatin (ZOCOR) 40 MG tablet Take 40 mg by mouth daily.    [provider]  tiotropium (SPIRIVA) 18  MCG inhalation capsule Place 18 mcg into inhaler and inhale daily.      [provider]      Allergies    Patient has no known allergies.    Review of Systems   Review of Systems  Skin:  Positive for rash.  All other systems reviewed and are negative.   Physical Exam Updated Vital Signs BP 135/68 (BP Location: Right Arm)   Pulse 81   Temp 98.1 F (36.7 C) (Oral)   Resp 16   Ht 5\' 1"  (1.549 m)   Wt 87.1 kg   SpO2 95%   BMI 36.28 kg/m  Physical Exam Vitals and nursing note reviewed. Exam conducted with a chaperone present.  Constitutional:      General: She is not in acute distress.    Appearance: She is well-developed.  HENT:     Head:  Normocephalic and atraumatic.  Eyes:     Conjunctiva/sclera: Conjunctivae normal.  Cardiovascular:     Rate and Rhythm: Normal rate and regular rhythm.     Heart sounds: No murmur heard. Pulmonary:     Effort: Pulmonary effort is normal. No respiratory distress.     Breath sounds: Normal breath sounds.  Abdominal:     Palpations: Abdomen is soft.     Tenderness: There is no abdominal tenderness.  Genitourinary:    Labia:        Right: Lesion present.        Left: Lesion present.      Comments: Whitened skin plaques present along bilateral labia and along inguinal crease. Plaques do not rub off, not suggestive of yeast infection. Musculoskeletal:        General: No swelling.     Cervical back: Neck supple.  Skin:    General: Skin is warm and dry.     Capillary Refill: Capillary refill takes less than 2 seconds.  Neurological:     Mental Status: She is alert.  Psychiatric:        Mood and Affect: Mood normal.     ED Results / Procedures / Treatments   Labs (all labs ordered are listed, but only abnormal results are displayed) Labs Reviewed  URINALYSIS, ROUTINE W REFLEX MICROSCOPIC - Abnormal; Notable for the following components:      Result Value   Color, Urine STRAW (*)    Glucose, UA >=500 (*)    All other components within normal limits  WET PREP, GENITAL  URINALYSIS, MICROSCOPIC (REFLEX)    EKG None  Radiology No results found.  Procedures Procedures    Medications Ordered in ED Medications - No data to display  ED Course/ Medical Decision Making/ A&P                                Medical Decision Making Amount and/or Complexity of Data Reviewed Labs: ordered.   This patient presents to the ED for concern of vaginal itching, irritation.  Differential diagnosis includes yeast infection, BV, lichen sclerosus, cellulitis   Lab Tests:  I Ordered, and personally interpreted labs.  The pertinent results include: Urinalysis unremarkable for signs of  infection, wet prep negative   Problem List / ED Course:  Patient presents the emergency department history significant for lichen sclerosus here with concerns of vaginal itching and irritation.  She reports that this has been ongoing for the last week.  Has previously been on clobetasol but states that she has been  off this medication for several months.  She is planning on following up with her OB/GYN as well as dermatology for further evaluation as she is having failure to have full remission of the symptoms. On exam, patient notably does have white plaques present to the vulva as well as the inguinal creases.  No obvious yeast type findings.  Wet prep and urinalysis collected for further testing.  Low concern for gonorrhea or chlamydia as patient reports no sexual activity in the last 15 years. UA and wet prep unremarkable.  Based on exam, do feel that this is likely irritation from lichen sclerosus.  Will restart patient on her clobetasol topical ointment.  Advised patient to use medication as needed for the next week and slowly reduce the amount that she is using to avoid worsening skin irritation.  Encourage close follow-up with her OB/GYN as well as dermatology for further assessment.  No concerning lesions found on pelvic examination at this time.  Advise return precautions such as new or worsening symptoms.  Patient otherwise stable for outpatient follow-up and discharged home.  Final Clinical Impression(s) / ED Diagnoses Final diagnoses:  Lichen sclerosus of vulva    Rx / DC Orders ED Discharge Orders          Ordered    clobetasol ointment (TEMOVATE) 0.05 %  2 times daily        12/12/23 1722              Zamari Vea A, PA-C 12/12/23 1726    Mordecai Applebaum, MD 12/12/23 2142

## 2023-12-12 NOTE — Discharge Instructions (Signed)
 You were seen in the ER today for concerns of vaginal itching and irritation. You have no signs of a UTI or yeast infection, but it appears that your lichen sclerosus has flared up. I have restarted you on your steroid ointment which you should use twice daily for the next several days until symptoms are improving. After improvement, reduce how often you are using this medication.
